# Patient Record
Sex: Female | Born: 1960 | Race: White | Hispanic: No | Marital: Married | Smoking: Current some day smoker
Health system: Southern US, Community
[De-identification: ages and names within clinical notes are randomized; demographics above are authoritative.]

## PROBLEM LIST (undated history)

## (undated) ENCOUNTER — Ambulatory Visit: Admission: EM | Payer: No Typology Code available for payment source

## (undated) DIAGNOSIS — G4730 Sleep apnea, unspecified: Secondary | ICD-10-CM

## (undated) DIAGNOSIS — D649 Anemia, unspecified: Secondary | ICD-10-CM

## (undated) DIAGNOSIS — M1990 Unspecified osteoarthritis, unspecified site: Secondary | ICD-10-CM

## (undated) DIAGNOSIS — I10 Essential (primary) hypertension: Secondary | ICD-10-CM

## (undated) DIAGNOSIS — E785 Hyperlipidemia, unspecified: Secondary | ICD-10-CM

## (undated) DIAGNOSIS — K219 Gastro-esophageal reflux disease without esophagitis: Secondary | ICD-10-CM

## (undated) DIAGNOSIS — E119 Type 2 diabetes mellitus without complications: Secondary | ICD-10-CM

## (undated) HISTORY — DX: Gastro-esophageal reflux disease without esophagitis: K21.9

## (undated) HISTORY — DX: Type 2 diabetes mellitus without complications: E11.9

## (undated) HISTORY — DX: Sleep apnea, unspecified: G47.30

## (undated) HISTORY — PX: ABDOMINAL HYSTERECTOMY: SHX81

## (undated) HISTORY — PX: APPENDECTOMY: SHX54

## (undated) HISTORY — DX: Hyperlipidemia, unspecified: E78.5

## (undated) HISTORY — DX: Unspecified osteoarthritis, unspecified site: M19.90

## (undated) HISTORY — PX: LAPAROSCOPIC CHOLECYSTECTOMY: SUR755

## (undated) HISTORY — PX: GASTRIC BYPASS: SHX52

## (undated) HISTORY — DX: Essential (primary) hypertension: I10

---

## 2005-05-30 ENCOUNTER — Ambulatory Visit: Payer: Self-pay

## 2012-01-17 HISTORY — PX: GASTRIC BYPASS: SHX52

## 2016-08-07 ENCOUNTER — Emergency Department
Admission: EM | Admit: 2016-08-07 | Discharge: 2016-08-07 | Disposition: A | Discharge status: Home / Self Care | Payer: 59 | Attending: Emergency Medicine | Admitting: Emergency Medicine

## 2016-08-07 ENCOUNTER — Encounter: Payer: Self-pay | Admitting: *Deleted

## 2016-08-07 DIAGNOSIS — J029 Acute pharyngitis, unspecified: Secondary | ICD-10-CM | POA: Diagnosis not present

## 2016-08-07 DIAGNOSIS — H9209 Otalgia, unspecified ear: Secondary | ICD-10-CM | POA: Diagnosis not present

## 2016-08-07 DIAGNOSIS — Z5321 Procedure and treatment not carried out due to patient leaving prior to being seen by health care provider: Secondary | ICD-10-CM | POA: Insufficient documentation

## 2016-08-07 NOTE — ED Notes (Signed)
Patient told Natasha Brine RN she was supposed to be seen at [facility] this AM.  Left after triage to go to  [facility].

## 2016-08-07 NOTE — ED Triage Notes (Signed)
States nasal dryness, sore throat and ear ache for several months

## 2017-04-02 ENCOUNTER — Other Ambulatory Visit: Payer: Self-pay | Admitting: Medical Oncology

## 2017-04-02 DIAGNOSIS — Z1231 Encounter for screening mammogram for malignant neoplasm of breast: Secondary | ICD-10-CM

## 2017-04-09 ENCOUNTER — Encounter (INDEPENDENT_AMBULATORY_CARE_PROVIDER_SITE_OTHER): Payer: Self-pay

## 2017-04-09 ENCOUNTER — Ambulatory Visit
Admission: RE | Admit: 2017-04-09 | Discharge: 2017-04-09 | Disposition: A | Discharge status: Home / Self Care | Payer: 59 | Source: Ambulatory Visit | Attending: Medical Oncology | Admitting: Medical Oncology

## 2017-04-09 DIAGNOSIS — N6310 Unspecified lump in the right breast, unspecified quadrant: Secondary | ICD-10-CM | POA: Diagnosis not present

## 2017-04-09 DIAGNOSIS — Z1231 Encounter for screening mammogram for malignant neoplasm of breast: Secondary | ICD-10-CM | POA: Diagnosis present

## 2017-04-09 DIAGNOSIS — R928 Other abnormal and inconclusive findings on diagnostic imaging of breast: Secondary | ICD-10-CM | POA: Insufficient documentation

## 2017-04-12 ENCOUNTER — Inpatient Hospital Stay
Admission: RE | Admit: 2017-04-12 | Discharge: 2017-04-12 | Disposition: A | Discharge status: Home / Self Care | Payer: Self-pay | Source: Ambulatory Visit | Attending: *Deleted | Admitting: *Deleted

## 2017-04-12 ENCOUNTER — Other Ambulatory Visit: Payer: Self-pay | Admitting: *Deleted

## 2017-04-12 DIAGNOSIS — Z9289 Personal history of other medical treatment: Secondary | ICD-10-CM

## 2017-04-16 ENCOUNTER — Other Ambulatory Visit: Payer: Self-pay | Admitting: Medical Oncology

## 2017-04-16 DIAGNOSIS — N6310 Unspecified lump in the right breast, unspecified quadrant: Secondary | ICD-10-CM

## 2017-04-16 DIAGNOSIS — R928 Other abnormal and inconclusive findings on diagnostic imaging of breast: Secondary | ICD-10-CM

## 2017-04-27 ENCOUNTER — Other Ambulatory Visit: Payer: 59

## 2017-04-27 ENCOUNTER — Ambulatory Visit: Payer: 59

## 2017-05-04 ENCOUNTER — Ambulatory Visit
Admission: RE | Admit: 2017-05-04 | Discharge: 2017-05-04 | Disposition: A | Discharge status: Home / Self Care | Payer: 59 | Source: Ambulatory Visit | Attending: Medical Oncology | Admitting: Medical Oncology

## 2017-05-04 DIAGNOSIS — N6310 Unspecified lump in the right breast, unspecified quadrant: Secondary | ICD-10-CM | POA: Diagnosis present

## 2017-05-04 DIAGNOSIS — R928 Other abnormal and inconclusive findings on diagnostic imaging of breast: Secondary | ICD-10-CM

## 2017-05-04 DIAGNOSIS — N6313 Unspecified lump in the right breast, lower outer quadrant: Secondary | ICD-10-CM | POA: Diagnosis not present

## 2017-05-07 ENCOUNTER — Other Ambulatory Visit: Payer: Self-pay | Admitting: Medical Oncology

## 2017-05-07 DIAGNOSIS — N6310 Unspecified lump in the right breast, unspecified quadrant: Secondary | ICD-10-CM

## 2017-05-07 DIAGNOSIS — R928 Other abnormal and inconclusive findings on diagnostic imaging of breast: Secondary | ICD-10-CM

## 2017-05-16 HISTORY — PX: BREAST BIOPSY: SHX20

## 2017-05-17 ENCOUNTER — Ambulatory Visit
Admission: RE | Admit: 2017-05-17 | Discharge: 2017-05-17 | Disposition: A | Discharge status: Home / Self Care | Payer: Managed Care, Other (non HMO) | Source: Ambulatory Visit | Attending: Medical Oncology | Admitting: Medical Oncology

## 2017-05-17 DIAGNOSIS — N6001 Solitary cyst of right breast: Secondary | ICD-10-CM | POA: Insufficient documentation

## 2017-05-17 DIAGNOSIS — N6310 Unspecified lump in the right breast, unspecified quadrant: Secondary | ICD-10-CM

## 2017-05-17 DIAGNOSIS — R928 Other abnormal and inconclusive findings on diagnostic imaging of breast: Secondary | ICD-10-CM

## 2018-09-24 ENCOUNTER — Other Ambulatory Visit: Payer: Self-pay | Admitting: Medical Oncology

## 2018-09-24 DIAGNOSIS — Z1231 Encounter for screening mammogram for malignant neoplasm of breast: Secondary | ICD-10-CM

## 2018-10-09 ENCOUNTER — Other Ambulatory Visit: Payer: Self-pay

## 2018-10-09 ENCOUNTER — Encounter (INDEPENDENT_AMBULATORY_CARE_PROVIDER_SITE_OTHER): Payer: Self-pay

## 2018-10-09 ENCOUNTER — Ambulatory Visit
Admission: RE | Admit: 2018-10-09 | Discharge: 2018-10-09 | Disposition: A | Discharge status: Home / Self Care | Payer: Commercial Managed Care - PPO | Source: Ambulatory Visit | Attending: Medical Oncology | Admitting: Medical Oncology

## 2018-10-09 DIAGNOSIS — Z1231 Encounter for screening mammogram for malignant neoplasm of breast: Secondary | ICD-10-CM | POA: Diagnosis present

## 2019-09-08 ENCOUNTER — Telehealth: Payer: Self-pay | Admitting: Emergency Medicine

## 2019-09-08 ENCOUNTER — Other Ambulatory Visit: Payer: Self-pay

## 2019-09-08 ENCOUNTER — Encounter: Payer: Self-pay | Admitting: Surgery

## 2019-09-08 ENCOUNTER — Ambulatory Visit: Payer: Commercial Managed Care - PPO | Admitting: Surgery

## 2019-09-08 ENCOUNTER — Telehealth: Payer: Self-pay | Admitting: Surgery

## 2019-09-08 VITALS — BP 162/84 | HR 68 | Temp 98.2°F | Resp 12 | Ht 66.0 in | Wt 269.4 lb

## 2019-09-08 DIAGNOSIS — L723 Sebaceous cyst: Secondary | ICD-10-CM

## 2019-09-08 NOTE — Telephone Encounter (Signed)
Medical Clearance sent to [facility], [place].  [phone]

## 2019-09-08 NOTE — Telephone Encounter (Signed)
Pt has been advised of Pre-Admission date/time, COVID Testing date and Surgery date.  Surgery Date: 09/23/19 Preadmission Testing Date: 09/17/19 (phone 8a-1p) Covid Testing Date: 09/19/19 - patient advised to go to the [facility] between 8a-1p   Patient has been made aware to call [phone], between 1-3:00pm the day before surgery, to find out what time to arrive for surgery.

## 2019-09-08 NOTE — H&P (View-Only) (Signed)
09/08/2019  Reason for Visit:  Lower back cyst  Referring Provider:  Nelwyn Salisbury, PA-C  History of Present Illness: Natasha Contreras is a 59 y.o. female presenting for evaluation of a lower back cyst vs pilonidal cyst.  The patient reports that she has had this for many years, at least 40 years.  She reports that some providers in the past have alluded to this being related to her weight (she she was about 150 lbs) or to a herniated disc.  The patient reports that it feels soft, like has fluid inside.  Has never drained spontaneously or via procedure, but it does flare up from time to time with getting larger, more tender, and feeling warm to touch.  Currently she's getting better from a flare up and reports still having some discomfort.  When the episode is bad, she has a hard time sitting due to the pain, and also feels more discomfort when straining for a bowel movement.  Past Medical History: Past Medical History:  Diagnosis Date  . Arthritis   . Diabetes mellitus, type 2 ([place])   . GERD (gastroesophageal reflux disease)   . Hypertension   . Sleep apnea      Past Surgical History: Past Surgical History:  Procedure Laterality Date  . ABDOMINAL HYSTERECTOMY    . APPENDECTOMY    . BREAST BIOPSY Right 05/2017   neg  . GASTRIC BYPASS    . LAPAROSCOPIC CHOLECYSTECTOMY      Home Medications: Prior to Admission medications   Medication Sig Start Date End Date Taking? Authorizing Provider  tylenol       omeprazole 40 mg daily      rosuvastatin (CRESTOR) 10 MG tablet Take 10 mg by mouth daily.   Yes [provider]    Allergies: Allergies  Allergen Reactions  . Clindamycin Swelling    Tongue swelling  . Penicillins     Social History:  reports that she has been smoking cigarettes. She has never used smokeless tobacco. She reports that she does not drink alcohol and does not use drugs.   Family History: Family History  Problem Relation Age of Onset  . Breast  cancer Maternal Grandmother 1    Review of Systems: Review of Systems  Constitutional: Negative for chills and fever.  HENT: Negative for hearing loss.   Respiratory: Negative for shortness of breath.   Cardiovascular: Negative for chest pain.  Gastrointestinal: Negative for abdominal pain, nausea and vomiting.  Genitourinary: Negative for dysuria.  Musculoskeletal: Positive for joint pain.  Skin:       Pain in lower back cyst area  Neurological: Negative for dizziness.  Psychiatric/Behavioral: Negative for depression.    Physical Exam BP (!) 162/84   Pulse 68   Temp 98.2 F (36.8 C)   Resp 12   Ht 5\' 6"  (1.676 m)   Wt 269 lb 6.4 oz (122.2 kg)   SpO2 94%   BMI 43.48 kg/m  CONSTITUTIONAL: No acute distress HEENT:  Normocephalic, atraumatic, extraocular motion intact. NECK: Trachea is midline, and there is no jugular venous distension.  RESPIRATORY:  Lungs are clear, and breath sounds are equal bilaterally. Normal respiratory effort without pathologic use of accessory muscles. CARDIOVASCULAR: Heart is regular without murmurs, gallops, or rubs. GI: The abdomen is soft, obese, non-distended, non-tender.  MUSCULOSKELETAL:  Normal muscle strength and tone in all four extremities.  No peripheral edema or cyanosis. SKIN: The lower back has a large area measuring 15 cm x 12 cm in  size, soft, fluid like sensation, most likely a large sebaceous cyst.  The gluteal cleft itself does not show any sinuses or areas of cysts.  NEUROLOGIC:  Motor and sensation is grossly normal.  Cranial nerves are grossly intact. PSYCH:  Alert and oriented to person, place and time. Affect is normal.  Laboratory Analysis: No results found for this or any previous visit (from the past 24 hour(s)).  Imaging: No results found.  Assessment and Plan: This is a 59 y.o. female with a large lower back cyst, likely sebaceous cyst.  --Discussed with the patient that this could be a sebaceous cyst of the lower  back.  It is higher up in location to be a pilonidal cyst, particularly with the gluteal cleft area looking intact.  Discussed with her that this can be excised, but due to the size of it, would be better to do in the OR.  We would plan for lower back cyst excision in the OR, with drain placement to help collapse the large cavity that would be left behind.  Discussed the possibility of sutures vs staples to close the skin.  Discussed risks of bleeding, infection, injury to surrounding structures.  Discussed the need for a drain to help empty any fluid in the area.  She's willing to proceed. --Will schedule her for 9/7 in the OR.  She understands that she would need COVID test prior to surgery.  Face-to-face time spent with the patient and care providers was 60 minutes, with more than 50% of the time spent counseling, educating, and coordinating care of the patient.     Melvyn Neth, [facility]

## 2019-09-08 NOTE — Progress Notes (Signed)
09/08/2019  Reason for Visit:  Lower back cyst  Referring Provider:  Nelwyn Salisbury, PA-C  History of Present Illness: Natasha Contreras is a 59 y.o. female presenting for evaluation of a lower back cyst vs pilonidal cyst.  The patient reports that she has had this for many years, at least 23 years.  She reports that some providers in the past have alluded to this being related to her weight (she she was about 150 lbs) or to a herniated disc.  The patient reports that it feels soft, like has fluid inside.  Has never drained spontaneously or via procedure, but it does flare up from time to time with getting larger, more tender, and feeling warm to touch.  Currently she's getting better from a flare up and reports still having some discomfort.  When the episode is bad, she has a hard time sitting due to the pain, and also feels more discomfort when straining for a bowel movement.  Past Medical History: Past Medical History:  Diagnosis Date  . Arthritis   . Diabetes mellitus, type 2 ([place])   . GERD (gastroesophageal reflux disease)   . Hypertension   . Sleep apnea      Past Surgical History: Past Surgical History:  Procedure Laterality Date  . ABDOMINAL HYSTERECTOMY    . APPENDECTOMY    . BREAST BIOPSY Right 05/2017   neg  . GASTRIC BYPASS    . LAPAROSCOPIC CHOLECYSTECTOMY      Home Medications: Prior to Admission medications   Medication Sig Start Date End Date Taking? Authorizing Provider  tylenol       omeprazole 40 mg daily      rosuvastatin (CRESTOR) 10 MG tablet Take 10 mg by mouth daily.   Yes [provider]    Allergies: Allergies  Allergen Reactions  . Clindamycin Swelling    Tongue swelling  . Penicillins     Social History:  reports that she has been smoking cigarettes. She has never used smokeless tobacco. She reports that she does not drink alcohol and does not use drugs.   Family History: Family History  Problem Relation Age of Onset  . Breast  cancer Maternal Grandmother 65    Review of Systems: Review of Systems  Constitutional: Negative for chills and fever.  HENT: Negative for hearing loss.   Respiratory: Negative for shortness of breath.   Cardiovascular: Negative for chest pain.  Gastrointestinal: Negative for abdominal pain, nausea and vomiting.  Genitourinary: Negative for dysuria.  Musculoskeletal: Positive for joint pain.  Skin:       Pain in lower back cyst area  Neurological: Negative for dizziness.  Psychiatric/Behavioral: Negative for depression.    Physical Exam BP (!) 162/84   Pulse 68   Temp 98.2 F (36.8 C)   Resp 12   Ht 5\' 6"  (1.676 m)   Wt 269 lb 6.4 oz (122.2 kg)   SpO2 94%   BMI 43.48 kg/m  CONSTITUTIONAL: No acute distress HEENT:  Normocephalic, atraumatic, extraocular motion intact. NECK: Trachea is midline, and there is no jugular venous distension.  RESPIRATORY:  Lungs are clear, and breath sounds are equal bilaterally. Normal respiratory effort without pathologic use of accessory muscles. CARDIOVASCULAR: Heart is regular without murmurs, gallops, or rubs. GI: The abdomen is soft, obese, non-distended, non-tender.  MUSCULOSKELETAL:  Normal muscle strength and tone in all four extremities.  No peripheral edema or cyanosis. SKIN: The lower back has a large area measuring 15 cm x 12 cm in  size, soft, fluid like sensation, most likely a large sebaceous cyst.  The gluteal cleft itself does not show any sinuses or areas of cysts.  NEUROLOGIC:  Motor and sensation is grossly normal.  Cranial nerves are grossly intact. PSYCH:  Alert and oriented to person, place and time. Affect is normal.  Laboratory Analysis: No results found for this or any previous visit (from the past 24 hour(s)).  Imaging: No results found.  Assessment and Plan: This is a 59 y.o. female with a large lower back cyst, likely sebaceous cyst.  --Discussed with the patient that this could be a sebaceous cyst of the lower  back.  It is higher up in location to be a pilonidal cyst, particularly with the gluteal cleft area looking intact.  Discussed with her that this can be excised, but due to the size of it, would be better to do in the OR.  We would plan for lower back cyst excision in the OR, with drain placement to help collapse the large cavity that would be left behind.  Discussed the possibility of sutures vs staples to close the skin.  Discussed risks of bleeding, infection, injury to surrounding structures.  Discussed the need for a drain to help empty any fluid in the area.  She's willing to proceed. --Will schedule her for 9/7 in the OR.  She understands that she would need COVID test prior to surgery.  Face-to-face time spent with the patient and care providers was 60 minutes, with more than 50% of the time spent counseling, educating, and coordinating care of the patient.     Melvyn Neth, [facility]

## 2019-09-08 NOTE — Patient Instructions (Addendum)
Our surgery scheduler will contact you to schedule your surgery. Please have the [facility] Sheet available when she calls you.   We will also send a Medical Clearance to your doctor. Their office will contact you if they need to see you to get this clearance done.   Please call the office if you have any questions or concerns.

## 2019-09-16 ENCOUNTER — Telehealth: Payer: Self-pay | Admitting: Emergency Medicine

## 2019-09-16 NOTE — Telephone Encounter (Signed)
Called office in regards to clearance form. Per Almyra Free, she states they received form and it is placed on doctors desk.

## 2019-09-17 ENCOUNTER — Other Ambulatory Visit: Payer: Self-pay

## 2019-09-17 ENCOUNTER — Other Ambulatory Visit
Admission: RE | Admit: 2019-09-17 | Discharge: 2019-09-17 | Disposition: A | Discharge status: Home / Self Care | Payer: Commercial Managed Care - PPO | Source: Ambulatory Visit | Attending: Surgery | Admitting: Surgery

## 2019-09-17 HISTORY — DX: Anemia, unspecified: D64.9

## 2019-09-17 NOTE — Patient Instructions (Signed)
Your procedure is scheduled on: September 23, 2019 Tuesday  Report to Day Surgery on the 2nd floor of the [facility]. To find out your arrival time, please call [phone] between 1PM - 3PM on: Friday September 19, 2019  REMEMBER: Instructions that are not followed completely may result in serious medical risk, up to and including death; or upon the discretion of your surgeon and anesthesiologist your surgery may need to be rescheduled.  Do not eat food after midnight the night before surgery.  No gum chewing, lozengers or hard candies.  You may however, drink CLEAR liquids up to 2 hours before you are scheduled to arrive for your surgery. Do not drink anything within 2 hours of your scheduled arrival time.  Clear liquids include: - water   Do NOT drink anything that is not on this list.  Type 1 and Type 2 diabetics should only drink water.   TAKE THESE MEDICATIONS THE MORNING OF SURGERY WITH A SIP OF WATER: ROSUVASTATIN  One week prior to surgery: Stop Anti-inflammatories (NSAIDS) such as Advil, Aleve, Ibuprofen, Motrin, Naproxen, Naprosyn and ASPIRIN  Aspirin based products such as Excedrin, Goodys Powder, BC Powder. Stop ANY OVER THE COUNTER supplements until after surgery. (You may continue taking  Tylenol, FERROUS Vitamin D, Vitamin B, and multivitamin.)  No Alcohol for 24 hours before or after surgery.  No Smoking including e-cigarettes for 24 hours prior to surgery.  No chewable tobacco products for at least 6 hours prior to surgery.  No nicotine patches on the day of surgery.  Do not use any "recreational" drugs for at least a week prior to your surgery.  Please be advised that the combination of cocaine and anesthesia may have negative outcomes, up to and including death. If you test positive for cocaine, your surgery will be cancelled.  On the morning of surgery brush your teeth with toothpaste and water, you may rinse your mouth with mouthwash if you wish. Do not  swallow any toothpaste or mouthwash.  Do not wear jewelry, make-up, hairpins, clips or nail polish.  Do not wear lotions, powders, or perfumes.   Do not shave 48 hours prior to surgery.   Contact lenses, hearing aids and dentures may not be worn into surgery.  Do not bring valuables to the hospital. [facility] is not responsible for any missing/lost belongings or valuables.   Use CHG Soap as directed on instruction sheet.  Bring your C-PAP to the hospital with you in case you may have to spend the night.   Notify your doctor if there is any change in your medical condition (cold, fever, infection).  Wear comfortable clothing (specific to your surgery type) to the hospital.  Plan for stool softeners for home use; pain medications have a tendency to cause constipation. You can also help prevent constipation by eating foods high in fiber such as fruits and vegetables and drinking plenty of fluids as your diet allows.  After surgery, you can help prevent lung complications by doing breathing exercises.  Take deep breaths and cough every 1-2 hours. Your doctor may order a device called an Incentive Spirometer to help you take deep breaths. When coughing or sneezing, hold a pillow firmly against your incision with both hands. This is called "splinting." Doing this helps protect your incision. It also decreases belly discomfort.  If you are being admitted to the hospital overnight, leave your suitcase in the car. After surgery it may be brought to your room.  If you  are being discharged the day of surgery, you will not be allowed to drive home. You will need a responsible adult (18 years or older) to drive you home and stay with you that night.   Please call the [facility]. at [phone] if you have any questions about these instructions.  Visitation Policy:  Patients undergoing a surgery or procedure may have one family member or support person with them as long  as that person is not COVID-19 positive or experiencing its symptoms.  That person may remain in the waiting area during the procedure.  Inpatient Visitation Update:   In an effort to ensure the safety of our team members and our patients, we are implementing a change to our visitation policy:  Effective Monday, Aug. 9, at 7 a.m., inpatients will be allowed one support person.  o The support person may change daily.  o The support person must pass our screening, gel in and out, and wear a mask at all times, including in the patient's room.  o Patients must also wear a mask when staff or their support person are in the room.  o Masking is required regardless of vaccination status.  Systemwide, no visitors 17 or younger.

## 2019-09-19 ENCOUNTER — Other Ambulatory Visit: Payer: Self-pay

## 2019-09-19 ENCOUNTER — Other Ambulatory Visit
Admission: RE | Admit: 2019-09-19 | Discharge: 2019-09-19 | Disposition: A | Discharge status: Home / Self Care | Payer: Commercial Managed Care - PPO | Source: Ambulatory Visit | Attending: Surgery | Admitting: Surgery

## 2019-09-19 DIAGNOSIS — Z20822 Contact with and (suspected) exposure to covid-19: Secondary | ICD-10-CM | POA: Diagnosis not present

## 2019-09-19 DIAGNOSIS — Z01812 Encounter for preprocedural laboratory examination: Secondary | ICD-10-CM | POA: Insufficient documentation

## 2019-09-19 LAB — SARS CORONAVIRUS 2 (TAT 6-24 HRS): SARS Coronavirus 2: NEGATIVE

## 2019-09-19 NOTE — Progress Notes (Signed)
Medical Clearance has been received from Nelwyn Salisbury PA-C's office. The patient has been cleared for surgery at Low risk.

## 2019-09-22 MED ORDER — DEXTROSE 5 % IV SOLN
3.0000 g | INTRAVENOUS | Status: AC
  Administered 2019-09-23: 3 g via INTRAVENOUS
  Filled 2019-09-22: qty 3000
  Filled 2019-09-22: qty 3

## 2019-09-23 ENCOUNTER — Encounter: Payer: Self-pay | Admitting: Surgery

## 2019-09-23 ENCOUNTER — Ambulatory Visit: Payer: Commercial Managed Care - PPO | Admitting: Urgent Care

## 2019-09-23 ENCOUNTER — Ambulatory Visit
Admission: RE | Admit: 2019-09-23 | Discharge: 2019-09-23 | Disposition: A | Discharge status: Home / Self Care | Payer: Commercial Managed Care - PPO | Source: Ambulatory Visit | Attending: Surgery | Admitting: Surgery

## 2019-09-23 ENCOUNTER — Encounter
Admission: RE | Disposition: A | Discharge status: Home / Self Care | Payer: Self-pay | Source: Ambulatory Visit | Attending: Surgery

## 2019-09-23 ENCOUNTER — Ambulatory Visit: Payer: Commercial Managed Care - PPO | Admitting: Certified Registered Nurse Anesthetist

## 2019-09-23 ENCOUNTER — Other Ambulatory Visit: Payer: Self-pay

## 2019-09-23 DIAGNOSIS — Z9049 Acquired absence of other specified parts of digestive tract: Secondary | ICD-10-CM | POA: Insufficient documentation

## 2019-09-23 DIAGNOSIS — Z79899 Other long term (current) drug therapy: Secondary | ICD-10-CM | POA: Insufficient documentation

## 2019-09-23 DIAGNOSIS — I10 Essential (primary) hypertension: Secondary | ICD-10-CM | POA: Insufficient documentation

## 2019-09-23 DIAGNOSIS — E119 Type 2 diabetes mellitus without complications: Secondary | ICD-10-CM | POA: Insufficient documentation

## 2019-09-23 DIAGNOSIS — D171 Benign lipomatous neoplasm of skin and subcutaneous tissue of trunk: Secondary | ICD-10-CM | POA: Diagnosis not present

## 2019-09-23 DIAGNOSIS — Z881 Allergy status to other antibiotic agents status: Secondary | ICD-10-CM | POA: Insufficient documentation

## 2019-09-23 DIAGNOSIS — M1990 Unspecified osteoarthritis, unspecified site: Secondary | ICD-10-CM | POA: Insufficient documentation

## 2019-09-23 DIAGNOSIS — K219 Gastro-esophageal reflux disease without esophagitis: Secondary | ICD-10-CM | POA: Diagnosis not present

## 2019-09-23 DIAGNOSIS — Z9884 Bariatric surgery status: Secondary | ICD-10-CM | POA: Diagnosis not present

## 2019-09-23 DIAGNOSIS — Z90710 Acquired absence of both cervix and uterus: Secondary | ICD-10-CM | POA: Insufficient documentation

## 2019-09-23 DIAGNOSIS — Z803 Family history of malignant neoplasm of breast: Secondary | ICD-10-CM | POA: Diagnosis not present

## 2019-09-23 DIAGNOSIS — G4730 Sleep apnea, unspecified: Secondary | ICD-10-CM | POA: Diagnosis not present

## 2019-09-23 DIAGNOSIS — D649 Anemia, unspecified: Secondary | ICD-10-CM | POA: Diagnosis not present

## 2019-09-23 DIAGNOSIS — Z880 Allergy status to penicillin: Secondary | ICD-10-CM | POA: Insufficient documentation

## 2019-09-23 DIAGNOSIS — L723 Sebaceous cyst: Secondary | ICD-10-CM

## 2019-09-23 DIAGNOSIS — F17210 Nicotine dependence, cigarettes, uncomplicated: Secondary | ICD-10-CM | POA: Insufficient documentation

## 2019-09-23 DIAGNOSIS — D179 Benign lipomatous neoplasm, unspecified: Secondary | ICD-10-CM | POA: Diagnosis present

## 2019-09-23 DIAGNOSIS — Z6841 Body Mass Index (BMI) 40.0 and over, adult: Secondary | ICD-10-CM | POA: Diagnosis not present

## 2019-09-23 HISTORY — PX: EXCISION OF BACK LESION: SHX6597

## 2019-09-23 LAB — GLUCOSE, CAPILLARY: Glucose-Capillary: 115 mg/dL — ABNORMAL HIGH (ref 70–99)

## 2019-09-23 SURGERY — EXCISION, LESION, BACK
Anesthesia: General

## 2019-09-23 MED ORDER — SUGAMMADEX SODIUM 500 MG/5ML IV SOLN
INTRAVENOUS | Status: AC
  Filled 2019-09-23: qty 5

## 2019-09-23 MED ORDER — GABAPENTIN 300 MG PO CAPS: 300.0000 mg | ORAL_CAPSULE | ORAL | Status: AC

## 2019-09-23 MED ORDER — BUPIVACAINE LIPOSOME 1.3 % IJ SUSP
INTRAMUSCULAR | Status: AC
  Filled 2019-09-23: qty 20

## 2019-09-23 MED ORDER — OXYCODONE HCL 5 MG PO TABS: 5.0000 mg | ORAL_TABLET | ORAL | 0 refills | Status: DC | PRN

## 2019-09-23 MED ORDER — ONDANSETRON HCL 4 MG/2ML IJ SOLN
INTRAMUSCULAR | Status: DC | PRN
  Administered 2019-09-23: 4 mg via INTRAVENOUS

## 2019-09-23 MED ORDER — CHLORHEXIDINE GLUCONATE CLOTH 2 % EX PADS: 6.0000 | MEDICATED_PAD | Freq: Once | CUTANEOUS | Status: DC

## 2019-09-23 MED ORDER — CHLORHEXIDINE GLUCONATE 0.12 % MT SOLN
OROMUCOSAL | Status: AC
  Administered 2019-09-23: 15 mL via OROMUCOSAL
  Filled 2019-09-23: qty 15

## 2019-09-23 MED ORDER — LIDOCAINE HCL (CARDIAC) PF 100 MG/5ML IV SOSY
PREFILLED_SYRINGE | INTRAVENOUS | Status: DC | PRN
  Administered 2019-09-23: 80 mg via INTRAVENOUS

## 2019-09-23 MED ORDER — OXYCODONE HCL 5 MG PO TABS
ORAL_TABLET | ORAL | Status: AC
  Filled 2019-09-23: qty 1

## 2019-09-23 MED ORDER — FENTANYL CITRATE (PF) 100 MCG/2ML IJ SOLN
INTRAMUSCULAR | Status: DC | PRN
Start: 2019-09-23 — End: 2019-09-23
  Administered 2019-09-23: 25 ug via INTRAVENOUS
  Administered 2019-09-23: 50 ug via INTRAVENOUS
  Administered 2019-09-23: 25 ug via INTRAVENOUS

## 2019-09-23 MED ORDER — LACTATED RINGERS IV SOLN: INTRAVENOUS | Status: DC

## 2019-09-23 MED ORDER — ACETAMINOPHEN 500 MG PO TABS: 1000.0000 mg | ORAL_TABLET | ORAL | Status: AC

## 2019-09-23 MED ORDER — BUPIVACAINE LIPOSOME 1.3 % IJ SUSP: 20.0000 mL | Freq: Once | INTRAMUSCULAR | Status: DC

## 2019-09-23 MED ORDER — FAMOTIDINE 20 MG PO TABS: 20.0000 mg | ORAL_TABLET | Freq: Once | ORAL | Status: AC

## 2019-09-23 MED ORDER — ROCURONIUM BROMIDE 100 MG/10ML IV SOLN
INTRAVENOUS | Status: DC | PRN
  Administered 2019-09-23 (×2): 10 mg via INTRAVENOUS
  Administered 2019-09-23: 50 mg via INTRAVENOUS

## 2019-09-23 MED ORDER — ORAL CARE MOUTH RINSE: 15.0000 mL | Freq: Once | OROMUCOSAL | Status: AC

## 2019-09-23 MED ORDER — FENTANYL CITRATE (PF) 100 MCG/2ML IJ SOLN: 25.0000 ug | INTRAMUSCULAR | Status: DC | PRN

## 2019-09-23 MED ORDER — BUPIVACAINE HCL (PF) 0.5 % IJ SOLN
INTRAMUSCULAR | Status: AC
  Filled 2019-09-23: qty 30

## 2019-09-23 MED ORDER — IBUPROFEN 600 MG PO TABS: 600.0000 mg | ORAL_TABLET | Freq: Three times a day (TID) | ORAL | 1 refills | Status: AC | PRN

## 2019-09-23 MED ORDER — SUGAMMADEX SODIUM 500 MG/5ML IV SOLN
INTRAVENOUS | Status: DC | PRN
  Administered 2019-09-23: 250 mg via INTRAVENOUS

## 2019-09-23 MED ORDER — ONDANSETRON HCL 4 MG/2ML IJ SOLN: 4.0000 mg | Freq: Once | INTRAMUSCULAR | Status: DC | PRN

## 2019-09-23 MED ORDER — DEXAMETHASONE SODIUM PHOSPHATE 10 MG/ML IJ SOLN
INTRAMUSCULAR | Status: DC | PRN
  Administered 2019-09-23: 5 mg via INTRAVENOUS

## 2019-09-23 MED ORDER — PROPOFOL 10 MG/ML IV BOLUS
INTRAVENOUS | Status: DC | PRN
  Administered 2019-09-23: 150 mg via INTRAVENOUS
  Administered 2019-09-23: 50 mg via INTRAVENOUS

## 2019-09-23 MED ORDER — FENTANYL CITRATE (PF) 100 MCG/2ML IJ SOLN
INTRAMUSCULAR | Status: AC
  Filled 2019-09-23: qty 2

## 2019-09-23 MED ORDER — MIDAZOLAM HCL 2 MG/2ML IJ SOLN
INTRAMUSCULAR | Status: DC | PRN
  Administered 2019-09-23: 2 mg via INTRAVENOUS

## 2019-09-23 MED ORDER — KETOROLAC TROMETHAMINE 30 MG/ML IJ SOLN
INTRAMUSCULAR | Status: DC | PRN
  Administered 2019-09-23: 30 mg via INTRAVENOUS

## 2019-09-23 MED ORDER — PROPOFOL 10 MG/ML IV BOLUS
INTRAVENOUS | Status: AC
  Filled 2019-09-23: qty 20

## 2019-09-23 MED ORDER — MIDAZOLAM HCL 2 MG/2ML IJ SOLN
INTRAMUSCULAR | Status: AC
  Filled 2019-09-23: qty 2

## 2019-09-23 MED ORDER — OXYCODONE HCL 5 MG PO TABS: 5.0000 mg | ORAL_TABLET | Freq: Once | ORAL | Status: DC

## 2019-09-23 MED ORDER — FAMOTIDINE 20 MG PO TABS
ORAL_TABLET | ORAL | Status: AC
  Administered 2019-09-23: 20 mg via ORAL
  Filled 2019-09-23: qty 1

## 2019-09-23 MED ORDER — GABAPENTIN 300 MG PO CAPS
ORAL_CAPSULE | ORAL | Status: AC
  Administered 2019-09-23: 300 mg via ORAL
  Filled 2019-09-23: qty 1

## 2019-09-23 MED ORDER — ACETAMINOPHEN 500 MG PO TABS
ORAL_TABLET | ORAL | Status: AC
  Administered 2019-09-23: 1000 mg via ORAL
  Filled 2019-09-23: qty 2

## 2019-09-23 MED ORDER — CHLORHEXIDINE GLUCONATE 0.12 % MT SOLN: 15.0000 mL | Freq: Once | OROMUCOSAL | Status: AC

## 2019-09-23 MED ORDER — BUPIVACAINE LIPOSOME 1.3 % IJ SUSP
INTRAMUSCULAR | Status: DC | PRN
  Administered 2019-09-23: 50 mL

## 2019-09-23 MED ORDER — EPHEDRINE SULFATE 50 MG/ML IJ SOLN
INTRAMUSCULAR | Status: DC | PRN
  Administered 2019-09-23: 5 mg via INTRAVENOUS
  Administered 2019-09-23: 10 mg via INTRAVENOUS
  Administered 2019-09-23: 5 mg via INTRAVENOUS

## 2019-09-23 MED ORDER — HYDROMORPHONE HCL 1 MG/ML IJ SOLN
INTRAMUSCULAR | Status: AC
Start: 2019-09-23 — End: ?
  Filled 2019-09-23: qty 1

## 2019-09-23 SURGICAL SUPPLY — 33 items
BULB RESERV EVAC DRAIN JP 100C (MISCELLANEOUS) ×2 IMPLANT
CHLORAPREP W/TINT 26 (MISCELLANEOUS) ×2 IMPLANT
COVER PROBE FLX POLY STRL (MISCELLANEOUS) ×2 IMPLANT
COVER WAND RF STERILE (DRAPES) ×2 IMPLANT
DERMABOND ADVANCED (GAUZE/BANDAGES/DRESSINGS) ×1
DERMABOND ADVANCED .7 DNX12 (GAUZE/BANDAGES/DRESSINGS) ×1 IMPLANT
DRAIN CHANNEL JP 15F RND 16 (MISCELLANEOUS) ×2 IMPLANT
DRAPE 3/4 80X56 (DRAPES) ×4 IMPLANT
DRAPE LAPAROTOMY 100X77 ABD (DRAPES) ×2 IMPLANT
DRSG TEGADERM 4X4.75 (GAUZE/BANDAGES/DRESSINGS) ×2 IMPLANT
ELECT CAUTERY BLADE 6.4 (BLADE) ×2 IMPLANT
ELECT REM PT RETURN 9FT ADLT (ELECTROSURGICAL) ×2
ELECTRODE REM PT RTRN 9FT ADLT (ELECTROSURGICAL) ×1 IMPLANT
GAUZE SPONGE 4X4 12PLY STRL (GAUZE/BANDAGES/DRESSINGS) ×2 IMPLANT
GLOVE SURG SYN 7.0 (GLOVE) ×6 IMPLANT
GLOVE SURG SYN 7.5  E (GLOVE) ×3
GLOVE SURG SYN 7.5 E (GLOVE) ×3 IMPLANT
GOWN STRL REUS W/ TWL LRG LVL3 (GOWN DISPOSABLE) ×2 IMPLANT
GOWN STRL REUS W/TWL LRG LVL3 (GOWN DISPOSABLE) ×2
KIT TURNOVER KIT A (KITS) ×2 IMPLANT
LABEL OR SOLS (LABEL) ×2 IMPLANT
NEEDLE HYPO 22GX1.5 SAFETY (NEEDLE) ×2 IMPLANT
NS IRRIG 1000ML POUR BTL (IV SOLUTION) ×2 IMPLANT
PACK BASIN MINOR (MISCELLANEOUS) ×2 IMPLANT
SPONGE LAP 18X18 RF (DISPOSABLE) ×4 IMPLANT
SUT MNCRL 4-0 (SUTURE) ×1
SUT MNCRL 4-0 27XMFL (SUTURE) ×1
SUT VIC AB 0 SH 27 (SUTURE) ×4 IMPLANT
SUT VIC AB 3-0 SH 27 (SUTURE) ×2
SUT VIC AB 3-0 SH 27X BRD (SUTURE) ×2 IMPLANT
SUTURE MNCRL 4-0 27XMF (SUTURE) ×1 IMPLANT
SYR 30ML LL (SYRINGE) ×2 IMPLANT
SYR BULB IRRIG 60ML STRL (SYRINGE) ×2 IMPLANT

## 2019-09-23 NOTE — Progress Notes (Signed)
Patients lip bleeding, DR Piscatella made aware. NO orders noted

## 2019-09-23 NOTE — Discharge Instructions (Signed)

## 2019-09-23 NOTE — Anesthesia Preprocedure Evaluation (Signed)
Anesthesia Evaluation  Patient identified by MRN, date of birth, ID band Patient awake    Reviewed: Allergy & Precautions, NPO status , Patient's Chart, lab work & pertinent test results  History of Anesthesia Complications Negative for: history of anesthetic complications  Airway Mallampati: III       Dental   Pulmonary sleep apnea and Continuous Positive Airway Pressure Ventilation , neg COPD, Current Smoker and Patient abstained from smoking.,           Cardiovascular hypertension, Pt. on medications (-) Past MI and (-) CHF (-) dysrhythmias (-) Valvular Problems/Murmurs     Neuro/Psych neg Seizures    GI/Hepatic Neg liver ROS, GERD  Medicated and Controlled,  Endo/Other  neg diabetesMorbid obesity  Renal/GU negative Renal ROS     Musculoskeletal   Abdominal   Peds  Hematology  (+) anemia ,   Anesthesia Other Findings   Reproductive/Obstetrics                             Anesthesia Physical Anesthesia Plan  ASA: III  Anesthesia Plan: General   Post-op Pain Management:    Induction: Intravenous  PONV Risk Score and Plan: 2 and Dexamethasone and Ondansetron  Airway Management Planned: Oral ETT  Additional Equipment:   Intra-op Plan:   Post-operative Plan:   Informed Consent: I have reviewed the patients History and Physical, chart, labs and discussed the procedure including the risks, benefits and alternatives for the proposed anesthesia with the patient or authorized representative who has indicated his/her understanding and acceptance.       Plan Discussed with:   Anesthesia Plan Comments:         Anesthesia Quick Evaluation

## 2019-09-23 NOTE — Anesthesia Procedure Notes (Signed)
Procedure Name: Intubation Date/Time: 09/23/2019 3:01 PM Performed by: Jerrye Noble, CRNA Pre-anesthesia Checklist: Patient identified, Emergency Drugs available, Suction available and Patient being monitored Patient Re-evaluated:Patient Re-evaluated prior to induction Oxygen Delivery Method: Circle system utilized Preoxygenation: Pre-oxygenation with 100% oxygen Induction Type: IV induction Ventilation: Mask ventilation without difficulty Laryngoscope Size: McGraph and 4 Grade View: Grade I Tube type: Oral Tube size: 7.0 mm Number of attempts: 1 Airway Equipment and Method: Stylet Placement Confirmation: ETT inserted through vocal cords under direct vision,  positive ETCO2 and breath sounds checked- equal and bilateral Secured at: 21 cm Tube secured with: Tape Dental Injury: Teeth and Oropharynx as per pre-operative assessment

## 2019-09-23 NOTE — Op Note (Signed)
  Procedure Date:  09/23/2019  Pre-operative Diagnosis:  Lower back mass  Post-operative Diagnosis:  Lower back lipomas  Procedure:  Excision of lower back lipomas  Surgeon:  Melvyn Neth, MD  Anesthesia:  General endotracheal  Estimated Blood Loss:  20 ml  Specimens:  Large lower back lipomas  Complications:  None  Indications for Procedure:  This is a 59 y.o. female with diagnosis of a symptomatic lower back mass/cyst.  The patient wishes to have this excised. The risks of bleeding, abscess or infection, injury to surrounding structures, and need for further procedures were all discussed with the patient and she was willing to proceed.  Description of Procedure: The patient was correctly identified in the preoperative area and brought into the operating room.  The patient was placed supine with VTE prophylaxis in place.  Appropriate time-outs were performed.  Anesthesia was induced and the patient was intubated.  Appropriate antibiotics were infused.  She was then turned to prone position.  The patient's back was prepped and draped in usual sterile fashion.  A 10 cm incision was made over the mass, and cautery was used to dissect down the subcutaneous tissue.  Skin flaps were created using cautery as well, and this revealed a large cluster of lipomas.  There was no single large mass but multiple smaller ones all together with their capsules adhering to each other.  The lipomas were removed in multiple groups over an area of about 15 x 15 cm.  I dissected as deep as possible without violating any muscular fascia, but some smaller clusters remained due to concerns for safety.  All these were sent off to pathology together.  The cavity was then irrigated and hemostasis was assured with cautery.  Local anesthetic was infused intradermally.  A 15 Fr. Blake drain was inserted into the wound from the right lateral aspect.  The skin edges were then approximated together and to the midline portion  of the wound bed using multiple 2-0 Vicryl sutures in order to decrease the amount of dead space in the wound.  The wound was then further closed in two layers using 3-0 Vicryl and 4-0 Monocryl.  The incision was cleaned and sealed with DermaBond.  The drain was secured using 3-0 Nylon and dressed with 4x4 gauze and tegaderm.  The patient was then placed in supine position, emerged from anesthesia, extubated, and brought to the recovery room for further management.    The patient tolerated the procedure well and all counts were correct at the end of the case.   Melvyn Neth, MD

## 2019-09-23 NOTE — OR Nursing (Signed)
Discussed patient's allergies to PCN and Clindamycin with Dr. Hampton Abbot. He reports it is okay to proceed with ancef.

## 2019-09-23 NOTE — Interval H&P Note (Signed)
History and Physical Interval Note:  09/23/2019 2:25 PM  Natasha Contreras  has presented today for surgery, with the diagnosis of Lower back cyst.  The various methods of treatment have been discussed with the patient and family. After consideration of risks, benefits and other options for treatment, the patient has consented to  Procedure(s): EXCISION OF BACK LESION (N/A) as a surgical intervention.  The patient's history has been reviewed, patient examined, no change in status, stable for surgery.  I have reviewed the patient's chart and labs.  Questions were answered to the patient's satisfaction.     Akaysha Cobern

## 2019-09-23 NOTE — Transfer of Care (Signed)
Immediate Anesthesia Transfer of Care Note  Patient: Natasha Contreras  Procedure(s) Performed: EXCISION OF BACK LESION (N/A )  Patient Location: PACU  Anesthesia Type:General  Level of Consciousness: awake and drowsy  Airway & Oxygen Therapy: Patient Spontanous Breathing and Patient connected to face mask oxygen  Post-op Assessment: Report given to RN and Post -op Vital signs reviewed and stable  Post vital signs: Reviewed and stable  Last Vitals:  Vitals Value Taken Time  BP 159/81 09/23/19 1756  Temp    Pulse 94 09/23/19 1759  Resp 20 09/23/19 1756  SpO2 98 % 09/23/19 1759  Vitals shown include unvalidated device data.  Last Pain:  Vitals:   09/23/19 1226  TempSrc: Temporal  PainSc: 4       Patients Stated Pain Goal: 2 (18/59/09 3112)  Complications: No complications documented.

## 2019-09-24 ENCOUNTER — Encounter: Payer: Self-pay | Admitting: Surgery

## 2019-09-25 LAB — SURGICAL PATHOLOGY

## 2019-09-25 NOTE — Anesthesia Postprocedure Evaluation (Signed)
Anesthesia Post Note  Patient: Natasha Contreras  Procedure(s) Performed: EXCISION OF BACK LESION (N/A )  Patient location during evaluation: PACU Anesthesia Type: General Level of consciousness: awake and alert Pain management: pain level controlled Vital Signs Assessment: post-procedure vital signs reviewed and stable Respiratory status: spontaneous breathing, nonlabored ventilation, respiratory function stable and patient connected to nasal cannula oxygen Cardiovascular status: blood pressure returned to baseline and stable Postop Assessment: no apparent nausea or vomiting Anesthetic complications: no   No complications documented.   Last Vitals:  Vitals:   09/23/19 1850 09/23/19 1854  BP: (!) 163/79 (!) 163/83  Pulse: 72 72  Resp: (!) 23 (!) 23  Temp: (!) 36.2 C (!) 36.2 C  SpO2: 98% 98%    Last Pain:  Vitals:   09/23/19 1915  TempSrc:   PainSc: 2                  Precious Haws Misti Towle

## 2019-09-29 ENCOUNTER — Encounter: Payer: Self-pay | Admitting: Surgery

## 2019-09-29 ENCOUNTER — Other Ambulatory Visit: Payer: Self-pay

## 2019-09-29 ENCOUNTER — Ambulatory Visit (INDEPENDENT_AMBULATORY_CARE_PROVIDER_SITE_OTHER): Payer: Commercial Managed Care - PPO | Admitting: Surgery

## 2019-09-29 VITALS — BP 152/87 | HR 75 | Temp 98.1°F | Resp 12 | Wt 267.4 lb

## 2019-09-29 DIAGNOSIS — Z09 Encounter for follow-up examination after completed treatment for conditions other than malignant neoplasm: Secondary | ICD-10-CM

## 2019-09-29 DIAGNOSIS — D171 Benign lipomatous neoplasm of skin and subcutaneous tissue of trunk: Secondary | ICD-10-CM

## 2019-09-29 NOTE — Patient Instructions (Addendum)
Begin to empty the drain repeatedly as the bulb gets full. Continue to keep track of the drain output as you are doing.  See your appointment below. Call the office if you have any questions or concerns.

## 2019-09-29 NOTE — Progress Notes (Signed)
09/29/2019  HPI: Natasha Contreras is a 59 y.o. female s/p excision of lower back lipomas on 09/23/19.  Drain was left in place due to the large size of the lipomas overall.  Pathology came back as lipoma, could possibly be an episacral lipoma, negative for malignancy.  Today the patient reports that she has been doing well, with no significant pain over the incision.  She did have some initial constipation but that has since resolved.  The only issue is that she has been emptying the drain once or twice a day only and the bulb has remained for without appropriate suction and a lot of fluid has been draining overall.  She reports that this fluid is serosanguineous in nature.  Vital signs: BP (!) 152/87    Pulse 75    Temp 98.1 F (36.7 C)    Resp 12    Wt 267 lb 6.4 oz (121.3 kg)    SpO2 97%    BMI 43.16 kg/m    Physical Exam: Constitutional: No acute distress Skin: Lower back incision is healing well and is clean, dry, and intact.  However there is a lot of fluid fluctuance underneath the wound.  The Blake drain was emptied for 380 mL in the office.  After that, the skin fluctuance had disappeared and it was more sunken and again.  Assessment/Plan: This is a 59 y.o. female s/p excision of lower back lipomas.  -Instructed the patient on how to empty the drain and record the output as needed perhaps multiple times per day in order to keep the bulb with good suction so that the layers of the skin and the subcutaneous tissue can scarred down together more easily.  She will keep better record and maintenance of the drain going forwards. --Discussed with her that the masses removed are indeed lipomas and not a large cyst as initially suspected.  I do not think these are episacral lipomas, as the mass was in the middle of her lower back, rather than on the sides were the nerves would penetrate the muscle.  Nonetheless, these are lipomas, and negative for malignancy. -Follow-up in 1 week to reassess and  possibly remove drain.   Melvyn Neth, [facility]

## 2019-10-06 ENCOUNTER — Ambulatory Visit (INDEPENDENT_AMBULATORY_CARE_PROVIDER_SITE_OTHER): Payer: Commercial Managed Care - PPO | Admitting: Surgery

## 2019-10-06 ENCOUNTER — Encounter: Payer: Self-pay | Admitting: Surgery

## 2019-10-06 ENCOUNTER — Other Ambulatory Visit: Payer: Self-pay

## 2019-10-06 VITALS — BP 110/81 | HR 82 | Temp 98.6°F | Resp 12 | Wt 267.8 lb

## 2019-10-06 DIAGNOSIS — D171 Benign lipomatous neoplasm of skin and subcutaneous tissue of trunk: Secondary | ICD-10-CM

## 2019-10-06 DIAGNOSIS — Z09 Encounter for follow-up examination after completed treatment for conditions other than malignant neoplasm: Secondary | ICD-10-CM

## 2019-10-06 MED ORDER — SULFAMETHOXAZOLE-TRIMETHOPRIM 400-80 MG PO TABS: 1.0000 | ORAL_TABLET | Freq: Two times a day (BID) | ORAL | 0 refills | Status: DC

## 2019-10-06 NOTE — Patient Instructions (Addendum)
Pick up your prescription at your local pharmacy.   Keep recording the output of the drainage. You will see Thedore Mins, PA at the [place] location.   See your appointment below. Call the office if you have any questions or concerns.

## 2019-10-06 NOTE — Progress Notes (Signed)
10/06/2019  HPI: Natasha Contreras is a 59 y.o. female s/p excision of lower back lipomas on 09/23/2019.  Drain was left in place due to the large size of the lipomas overall.  She was seen in the office last week on 9/13 during which time she was not keeping the drain to appropriate suction and a little fluid was still remaining.  We instructed her better on how to maintain the drain and she presents today for follow-up.  She reports that initially the drain was having a lot of output but then over the weekend he stopped draining fluid.  However she also feels that there is been more pressure on the backside some bloody fluid drainage.  She reports that when her husband sees the wound, there is some redness at the incision and around the drain.  Vital signs: BP 110/81   Pulse 82   Temp 98.6 F (37 C)   Resp 12   Wt 267 lb 12.8 oz (121.5 kg)   SpO2 98%   BMI 43.22 kg/m    Physical Exam: Constitutional: No acute distress Skin: Posterior midline incision has some mild surrounding erythema but otherwise the wound is intact with no drainage and healing well.  When evaluating the wound, the wound bed does look swollen with fluid and when looking at the drain, there is clot within the tubing.  The drain itself was stripped and immediately after approximately 320 mL of serosanguineous fluid was evacuated.  I reevaluated the wound, and the wound bed is more sunken in as it was last week after emptying fluid. Instructed the patient better about how to strip the drain so that this will continue to drain better  Assessment/Plan: This is a 59 y.o. female s/p excision of lower back lipomas on 09/23/2019.  -Instructed the patient better on how to strip the drain tubing itself.  Given that there is some mild erythema, 1 potential concern is that some of the fluid from the seroma was trying to get infected.  I will start the patient as a precaution on a 7-day course of Bactrim. -Patient will keep up with his drain  output and she will follow-up next week with Mr. Olean Ree in the office to reevaluate the wound and potentially remove the drain.  Discussed with the patient that we may still remove the drain even if the output is still high because it would have been at that point 3 weeks from surgery and I do not want that drain tract and wound cavity to get seeded with any skin bacteria.   Melvyn Neth, [facility]

## 2019-10-13 ENCOUNTER — Other Ambulatory Visit: Payer: Self-pay

## 2019-10-13 ENCOUNTER — Ambulatory Visit (INDEPENDENT_AMBULATORY_CARE_PROVIDER_SITE_OTHER): Payer: Commercial Managed Care - PPO | Admitting: Physician Assistant

## 2019-10-13 ENCOUNTER — Encounter: Payer: Self-pay | Admitting: Physician Assistant

## 2019-10-13 VITALS — BP 158/83 | HR 84 | Temp 97.9°F | Resp 12 | Ht 66.0 in | Wt 266.0 lb

## 2019-10-13 DIAGNOSIS — D171 Benign lipomatous neoplasm of skin and subcutaneous tissue of trunk: Secondary | ICD-10-CM

## 2019-10-13 DIAGNOSIS — Z09 Encounter for follow-up examination after completed treatment for conditions other than malignant neoplasm: Secondary | ICD-10-CM

## 2019-10-13 NOTE — Progress Notes (Signed)
[facility] POST-OP OFFICE VISIT  10/13/2019  HPI: Natasha Contreras is a 59 y.o. female 20 days s/p excision of lower back lipomas with Dr Hampton Abbot whose post-operative course has been complicated by large amounts of drainage and inadequate drain management at home for some time. Last seen on 09/20 and reviewed drain teaching/striping.   Today, she reports that this has been draining <10 ccs an hour. Sometimes the output varies depending on if she is moving a lot or not. This has remained serous. No purulence. No erythema around the drain. Only notices a "pulling sensation" in that area. No fever, chills. She has completed ABx. No other issues.   Vital signs: BP (!) 158/83   Pulse 84   Temp 97.9 F (36.6 C) (Oral)   Resp 12   Ht 5\' 6"  (1.676 m)   Wt 266 lb (120.7 kg)   SpO2 95%   BMI 42.93 kg/m    Physical Exam: Constitutional: Well appearing female, NAD Skin: incision to the lower back is healing well, there is a small area on the inferior portion of the wound which appears to have dehisced some superficially. No erythema or drainage. Drain is lateral to this on the right, output serous.   Assessment/Plan: This is a 59 y.o. female 20 days s/p excision of lower back lipomas   - I removed the drain today as the output is slowing down and she is ~3 weeks out to prevent tract formation. I placed dressing over this and reviewed wound care and aily changes. She understands signs and symptoms which would warrant more urgent return. No further ABx needed. She will follow up next week to ensure she continues to do well. She will call our office in the interim with any concerns.   -- Edison Simon, PA-C [facility] 10/13/2019, 11:12 AM [phone] M-F: 7am - 4pm

## 2019-10-13 NOTE — Patient Instructions (Signed)
We removed the drain today. Keep a dry dressing over the area. You may wash the area with soap and water and cover with a dry dressing.   Please see your follow up appointment listed below.

## 2019-10-22 ENCOUNTER — Encounter: Payer: Self-pay | Admitting: Physician Assistant

## 2019-10-22 ENCOUNTER — Other Ambulatory Visit: Payer: Self-pay

## 2019-10-22 ENCOUNTER — Telehealth: Payer: Self-pay | Admitting: *Deleted

## 2019-10-22 ENCOUNTER — Ambulatory Visit (INDEPENDENT_AMBULATORY_CARE_PROVIDER_SITE_OTHER): Payer: Commercial Managed Care - PPO | Admitting: Physician Assistant

## 2019-10-22 VITALS — BP 137/82 | HR 98 | Temp 98.6°F | Resp 14 | Ht 66.0 in | Wt 269.0 lb

## 2019-10-22 DIAGNOSIS — D171 Benign lipomatous neoplasm of skin and subcutaneous tissue of trunk: Secondary | ICD-10-CM

## 2019-10-22 DIAGNOSIS — Z09 Encounter for follow-up examination after completed treatment for conditions other than malignant neoplasm: Secondary | ICD-10-CM

## 2019-10-22 NOTE — Patient Instructions (Signed)
Please keep a dressing over the wound. We have applied silver nitrate-you will notice a dark grayish drainage today that is normal. Please keep a check  on your temperature-any reading of 101 or greater go to the emergency room.

## 2019-10-22 NOTE — Telephone Encounter (Signed)
Patient called back and now her fever is down to 99. She took two more Tylenol at 2 pm. Patient did not want to go to the ER. Patient instructed that if the temperature continued to go up to 101 or higher she should go to the ER as she may need a scan to see what the cause may be. She is amendable to this.

## 2019-10-22 NOTE — Telephone Encounter (Signed)
Message left for the patient to call back.  Per her aftercare instructions she was told to report to the ER for any temperatures of 101 or higher.

## 2019-10-22 NOTE — Progress Notes (Signed)
[facility] POST-OP OFFICE VISIT  10/22/2019  HPI: Natasha Contreras is a 59 y.o. female ~1 month s/p excision of lower back lipomas with Dr Hampton Abbot whose post-operative course has been complicated by large amounts of drainage which slowed and drain removed on 09/27  She comes into today secondary to complaints of new onset chills this morning at 3 AM and large amount of serous output from inferior portion of the wound. Prior to this she had been doing well and had no issues with the wound nor drainage. She has not measured her temperature at home and was afebrile here. Only other complaints is fatigue. No cough, congestion, CP, SOB, abdominal pain, or urinary changes. No other complaints.   Vital signs: BP 137/82   Pulse 98   Temp 98.6 F (37 C)   Resp 14   Ht 5\' 6"  (1.676 m)   Wt 269 lb (122 kg)   SpO2 97%   BMI 43.42 kg/m    Physical Exam: Constitutional: Patient lying in left lateral decubitus position, wrapped in blanket, shivering Skin: Incision to lower medial back and superior gluteal cleft is healing well, there is an area of superficial skin dehisence to the inferior last 2 cm of the wound, there is fibrinous exudate in the wound bed which I removed. She did have some serous drainage from the area, the wound was <5 mm when probed with q-tip. No surround erythema or tenderness. Previous drain site to right superior buttock is healed, no erythema or drainage.    Assessment/Plan: This is a 59 y.o. female ~1 month s/p excision of lower back lipomas   - I am unsure the source of her chills. She is afebrile, hemodynamically stable, and otherwise without symptoms. I encouraged her to monitor her temperature at home, and she may benefit from evaluation from her PCP/ED should symptoms worsen. I do NOT believe her lower back incision is infected at this time, and I am not sure there is any benefit to ABx at this time.   - I did apply silver nitrate to her wound bed to help  manage the drain, reviewed wound care with her  - No further intervention at this time  - I will follow up again next week to ensure wound continues to improve. She was advised to call with questions/concerns in the interim.   -- Edison Simon, PA-C [facility] 10/22/2019, 10:03 AM [phone] M-F: 7am - 4pm

## 2019-10-22 NOTE — Telephone Encounter (Signed)
Patient called and stated that she just took her temperature and it was 101.3. She took 2 tylenol at 10:15am

## 2019-10-23 ENCOUNTER — Encounter: Payer: Self-pay | Admitting: Physician Assistant

## 2019-10-30 ENCOUNTER — Encounter: Payer: Self-pay | Admitting: Physician Assistant

## 2019-10-30 ENCOUNTER — Other Ambulatory Visit: Payer: Self-pay

## 2019-10-30 ENCOUNTER — Ambulatory Visit (INDEPENDENT_AMBULATORY_CARE_PROVIDER_SITE_OTHER): Payer: Commercial Managed Care - PPO | Admitting: Physician Assistant

## 2019-10-30 VITALS — BP 128/68 | HR 84 | Temp 98.7°F | Ht 66.0 in | Wt 265.4 lb

## 2019-10-30 DIAGNOSIS — Z09 Encounter for follow-up examination after completed treatment for conditions other than malignant neoplasm: Secondary | ICD-10-CM

## 2019-10-30 NOTE — Patient Instructions (Signed)
Patient advised to keep scheduled appointment for Wednesday, and patient advised if she is feeling ok, she may contact the office and we can move her appointment back for a week.  Wound Care, Adult Taking care of your wound properly can help to prevent pain, infection, and scarring. It can also help your wound to heal more quickly. How to care for your wound Wound care      Follow instructions from your health care provider about how to take care of your wound. Make sure you: ? Wash your hands with soap and water before you change the bandage (dressing). If soap and water are not available, use hand sanitizer. ? Change your dressing as told by your health care provider. ? Leave stitches (sutures), skin glue, or adhesive strips in place. These skin closures may need to stay in place for 2 weeks or longer. If adhesive strip edges start to loosen and curl up, you may trim the loose edges. Do not remove adhesive strips completely unless your health care provider tells you to do that.  Check your wound area every day for signs of infection. Check for: ? Redness, swelling, or pain. ? Fluid or blood. ? Warmth. ? Pus or a bad smell.  Ask your health care provider if you should clean the wound with mild soap and water. Doing this may include: ? Using a clean towel to pat the wound dry after cleaning it. Do not rub or scrub the wound. ? Applying a cream or ointment. Do this only as told by your health care provider. ? Covering the incision with a clean dressing.  Ask your health care provider when you can leave the wound uncovered.  Keep the dressing dry until your health care provider says it can be removed. Do not take baths, swim, use a hot tub, or do anything that would put the wound underwater until your health care provider approves. Ask your health care provider if you can take showers. You may only be allowed to take sponge baths. Medicines   If you were prescribed an antibiotic  medicine, cream, or ointment, take or use the antibiotic as told by your health care provider. Do not stop taking or using the antibiotic even if your condition improves.  Take over-the-counter and prescription medicines only as told by your health care provider. If you were prescribed pain medicine, take it 30 or more minutes before you do any wound care or as told by your health care provider. General instructions  Return to your normal activities as told by your health care provider. Ask your health care provider what activities are safe.  Do not scratch or pick at the wound.  Do not use any products that contain nicotine or tobacco, such as cigarettes and e-cigarettes. These may delay wound healing. If you need help quitting, ask your health care provider.  Keep all follow-up visits as told by your health care provider. This is important.  Eat a diet that includes protein, vitamin A, vitamin C, and other nutrient-rich foods to help the wound heal. ? Foods rich in protein include meat, dairy, beans, nuts, and other sources. ? Foods rich in vitamin A include carrots and dark green, leafy vegetables. ? Foods rich in vitamin C include citrus, tomatoes, and other fruits and vegetables. ? Nutrient-rich foods have protein, carbohydrates, fat, vitamins, or minerals. Eat a variety of healthy foods including vegetables, fruits, and whole grains. Contact a health care provider if:  You received a tetanus shot  and you have swelling, severe pain, redness, or bleeding at the injection site.  Your pain is not controlled with medicine.  You have redness, swelling, or pain around the wound.  You have fluid or blood coming from the wound.  Your wound feels warm to the touch.  You have pus or a bad smell coming from the wound.  You have a fever or chills.  You are nauseous or you vomit.  You are dizzy. Get help right away if:  You have a red streak going away from your wound.  The edges of  the wound open up and separate.  Your wound is bleeding, and the bleeding does not stop with gentle pressure.  You have a rash.  You faint.  You have trouble breathing. Summary  Always wash your hands with soap and water before changing your bandage (dressing).  To help with healing, eat foods that are rich in protein, vitamin A, vitamin C, and other nutrients.  Check your wound every day for signs of infection. Contact your health care provider if you suspect that your wound is infected. This information is not intended to replace advice given to you by your health care provider. Make sure you discuss any questions you have with your health care provider. Document Revised: 04/22/2018 Document Reviewed: 07/20/2015 Elsevier Patient Education  [place].

## 2019-10-30 NOTE — Progress Notes (Signed)
[facility] POST-OP OFFICE VISIT  10/30/2019  HPI: Natasha Contreras is a 59 y.o. female ~1.5 months s/p excision of lower back lipomas with Dr Hampton Abbot whose post-operative course has been complicated by large amounts of drainage.  She presents today secondary to copious amounts of serous fluid from her wound over the last few days. She reports that she soaks through multiple pads a day. No fever or chills. No evidence of purulent drainage. No other complaints.    Vital signs: BP 128/68    Pulse 84    Temp 98.7 F (37.1 C) (Oral)    Ht 5\' 6"  (1.676 m)    Wt 265 lb 6.4 oz (120.4 kg)    SpO2 95%    BMI 42.84 kg/m    Physical Exam: Constitutional: Well appearing female, NAD Skin: The inferior portion to her incision to her medial lower back had dehisced and there was fibrinous tissue present. I debride this sharply and revealed a large space which was 100% granular tissue. There was serous fluid present. No evidence of purulence. The surrounding skin was without significant erythema. The drain site had healed well.   Assessment/Plan: This is a 59 y.o. female  ~1.5 months s/p excision of lower back lipomas   - I reviewed wound care with the patient in detail. Wound was packed with saline moistened Kerlix gauze and secured with dry gauze, ABD pad, and tape. She understands this needs to be done daily. 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 She understands this may take ~2 months or so to heal completely.   - I do not think she warrants Abx at this time.   - She will follow up weekly or bi-weekly as needed. I will be happy to see her sooner for any acute issues.   -- Edison Simon, PA-C [facility] 10/30/2019, 10:16 AM [phone] M-F: 7am - 4pm

## 2019-11-05 ENCOUNTER — Ambulatory Visit (INDEPENDENT_AMBULATORY_CARE_PROVIDER_SITE_OTHER): Payer: Commercial Managed Care - PPO | Admitting: Surgery

## 2019-11-05 ENCOUNTER — Encounter: Payer: Self-pay | Admitting: Surgery

## 2019-11-05 ENCOUNTER — Other Ambulatory Visit: Payer: Self-pay

## 2019-11-05 DIAGNOSIS — T8131XD Disruption of external operation (surgical) wound, not elsewhere classified, subsequent encounter: Secondary | ICD-10-CM

## 2019-11-05 DIAGNOSIS — D171 Benign lipomatous neoplasm of skin and subcutaneous tissue of trunk: Secondary | ICD-10-CM

## 2019-11-05 DIAGNOSIS — Z09 Encounter for follow-up examination after completed treatment for conditions other than malignant neoplasm: Secondary | ICD-10-CM

## 2019-11-05 NOTE — Patient Instructions (Addendum)
Dr.Piscoya suggest patient to change dressing when she feels the gauze has become saturated to preventing infection.  Patient advised to make sure the gauze is completely in the wound.   Wound Care, Adult Taking care of your wound properly can help to prevent pain, infection, and scarring. It can also help your wound to heal more quickly. How to care for your wound Wound care      Follow instructions from your health care provider about how to take care of your wound. Make sure you: ? Wash your hands with soap and water before you change the bandage (dressing). If soap and water are not available, use hand sanitizer. ? Change your dressing as told by your health care provider. ? Leave stitches (sutures), skin glue, or adhesive strips in place. These skin closures may need to stay in place for 2 weeks or longer. If adhesive strip edges start to loosen and curl up, you may trim the loose edges. Do not remove adhesive strips completely unless your health care provider tells you to do that.  Check your wound area every day for signs of infection. Check for: ? Redness, swelling, or pain. ? Fluid or blood. ? Warmth. ? Pus or a bad smell.  Ask your health care provider if you should clean the wound with mild soap and water. Doing this may include: ? Using a clean towel to pat the wound dry after cleaning it. Do not rub or scrub the wound. ? Applying a cream or ointment. Do this only as told by your health care provider. ? Covering the incision with a clean dressing.  Ask your health care provider when you can leave the wound uncovered.  Keep the dressing dry until your health care provider says it can be removed. Do not take baths, swim, use a hot tub, or do anything that would put the wound underwater until your health care provider approves. Ask your health care provider if you can take showers. You may only be allowed to take sponge baths. Medicines   If you were prescribed an antibiotic  medicine, cream, or ointment, take or use the antibiotic as told by your health care provider. Do not stop taking or using the antibiotic even if your condition improves.  Take over-the-counter and prescription medicines only as told by your health care provider. If you were prescribed pain medicine, take it 30 or more minutes before you do any wound care or as told by your health care provider. General instructions  Return to your normal activities as told by your health care provider. Ask your health care provider what activities are safe.  Do not scratch or pick at the wound.  Do not use any products that contain nicotine or tobacco, such as cigarettes and e-cigarettes. These may delay wound healing. If you need help quitting, ask your health care provider.  Keep all follow-up visits as told by your health care provider. This is important.  Eat a diet that includes protein, vitamin A, vitamin C, and other nutrient-rich foods to help the wound heal. ? Foods rich in protein include meat, dairy, beans, nuts, and other sources. ? Foods rich in vitamin A include carrots and dark green, leafy vegetables. ? Foods rich in vitamin C include citrus, tomatoes, and other fruits and vegetables. ? Nutrient-rich foods have protein, carbohydrates, fat, vitamins, or minerals. Eat a variety of healthy foods including vegetables, fruits, and whole grains. Contact a health care provider if:  You received a tetanus shot  and you have swelling, severe pain, redness, or bleeding at the injection site.  Your pain is not controlled with medicine.  You have redness, swelling, or pain around the wound.  You have fluid or blood coming from the wound.  Your wound feels warm to the touch.  You have pus or a bad smell coming from the wound.  You have a fever or chills.  You are nauseous or you vomit.  You are dizzy. Get help right away if:  You have a red streak going away from your wound.  The edges of  the wound open up and separate.  Your wound is bleeding, and the bleeding does not stop with gentle pressure.  You have a rash.  You faint.  You have trouble breathing. Summary  Always wash your hands with soap and water before changing your bandage (dressing).  To help with healing, eat foods that are rich in protein, vitamin A, vitamin C, and other nutrients.  Check your wound every day for signs of infection. Contact your health care provider if you suspect that your wound is infected. This information is not intended to replace advice given to you by your health care provider. Make sure you discuss any questions you have with your health care provider. Document Revised: 04/22/2018 Document Reviewed: 07/20/2015 Elsevier Patient Education  [place].

## 2019-11-05 NOTE — Progress Notes (Signed)
11/05/2019  HPI: Natasha Contreras is a 59 y.o. female s/p excision of a large lipoma cluster in the back on 09/23/2019.  She was seen in the office on 10/14 because her wound had opened and was draining copious amount of serous fluid.  The wound is now being packed with Kerlix gauze at home by her husband and friends and she presents today for follow-up.  She reports she has been doing well and feels that the wound is getting smaller.  Denies any worsening pain, redness, or purulent drainage.  Reports that she still having significant drainage still that does soak through dressings.  Vital signs: There were no vitals taken for this visit.   Physical Exam: Constitutional: No acute distress Skin: Lower back incision is open for about 7 cm x 3 cm.  The inside of the cavity measures about 10 cm x 10 cm.  The wound bed is healing well with very healthy granulation tissue throughout.  There is no evidence of purulence or necrosis or fibrinous material.  The wound was packed with moist Kerlix gauze dressed with dry gauze and ABD pad and tape.  Assessment/Plan: This is a 59 y.o. female s/p excision of large lipoma cluster in the lower back.  -Instructed patient to continue doing wet-to-dry dressing changes.  She may change the outer gauze as needed in order to prevent over saturation that could get to her clothes. -Follow-up in 2 weeks.   Melvyn Neth, [facility]

## 2019-11-19 ENCOUNTER — Ambulatory Visit (INDEPENDENT_AMBULATORY_CARE_PROVIDER_SITE_OTHER): Payer: Commercial Managed Care - PPO | Admitting: Surgery

## 2019-11-19 ENCOUNTER — Other Ambulatory Visit: Payer: Self-pay

## 2019-11-19 ENCOUNTER — Encounter: Payer: Self-pay | Admitting: Surgery

## 2019-11-19 ENCOUNTER — Encounter: Payer: Commercial Managed Care - PPO | Admitting: Surgery

## 2019-11-19 VITALS — BP 152/93 | HR 84 | Temp 98.9°F | Resp 12 | Wt 267.4 lb

## 2019-11-19 DIAGNOSIS — Z09 Encounter for follow-up examination after completed treatment for conditions other than malignant neoplasm: Secondary | ICD-10-CM

## 2019-11-19 DIAGNOSIS — T8131XD Disruption of external operation (surgical) wound, not elsewhere classified, subsequent encounter: Secondary | ICD-10-CM

## 2019-11-19 NOTE — Patient Instructions (Signed)
Continue to pack the area once a day. Put the gauze in the wound tightly and get all corners in.   See your appointment below. Call the office if you have any questions or concerns.

## 2019-11-20 ENCOUNTER — Encounter: Payer: Self-pay | Admitting: Surgery

## 2019-11-20 NOTE — Progress Notes (Signed)
11/19/2019  HPI: Natasha Contreras is a 59 y.o. 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  She had wound dehiscence and has been getting wet to dry packing dressing changes daily.  She presents for follow up.  Reports that there was some green colored drainage on her gauze recently and that the wound has become a bit more sore recently.  Vital signs: BP (!) 152/93   Pulse 84   Temp 98.9 F (37.2 C)   Resp 12   Wt 267 lb 6.4 oz (121.3 kg)   SpO2 96%   BMI 43.16 kg/m    Physical Exam: Constitutional:  No acute distress Skin:  Patient's lower back wound is open to about 3 x 5 cm external size, with cavity of about 10 x 8 cm.  The wound bed is healthy with good granulation tissue, there was some sloughing fibrinous material at the deep edges of the cavity, but no purulence or foul odor.  No evidence of infection.  Assessment/Plan: This is a 59 y.o. 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  --The patient's wound is still healthy and healing well, without any evidence of infection.  The green material she saw was probably fibrinous slough from the wound bed, which could be due to insufficient packing of the wound.  Instructed her that the wound needs to be packed better so the slough can be better removed with each dressing change.  Otherwise continue daily wet to dry dressing changes.  No antibiotics or debridements needed. --Follow up in two weeks.   Melvyn Neth, [facility]

## 2019-12-03 ENCOUNTER — Encounter: Payer: Commercial Managed Care - PPO | Admitting: Surgery

## 2019-12-05 ENCOUNTER — Ambulatory Visit (INDEPENDENT_AMBULATORY_CARE_PROVIDER_SITE_OTHER): Payer: Commercial Managed Care - PPO | Admitting: Surgery

## 2019-12-05 ENCOUNTER — Encounter: Payer: Self-pay | Admitting: Surgery

## 2019-12-05 ENCOUNTER — Other Ambulatory Visit: Payer: Self-pay

## 2019-12-05 VITALS — BP 154/83 | HR 77 | Temp 98.3°F | Ht 66.0 in | Wt 264.0 lb

## 2019-12-05 DIAGNOSIS — T8131XD Disruption of external operation (surgical) wound, not elsewhere classified, subsequent encounter: Secondary | ICD-10-CM

## 2019-12-05 DIAGNOSIS — Z09 Encounter for follow-up examination after completed treatment for conditions other than malignant neoplasm: Secondary | ICD-10-CM

## 2019-12-05 NOTE — Patient Instructions (Signed)
Continue to do daily packing. May start using less padding if drainage is decreasing. Rotate the tape.    Follow up here in 3 weeks.

## 2019-12-05 NOTE — Progress Notes (Signed)
12/05/2019  HPI: Natasha Contreras is a 59 y.o. female s/p excision of large lipoma cluster in the lower back on 09/23/19.  This was complicated by wound dehiscence requiring wet to dry packing dressing changes.  She presents today for follow up.  The wound continues to heal well.  Denies any worsening pain and believe the wound is smaller and is having less drainage.  Vital signs: BP (!) 154/83   Pulse 77   Temp 98.3 F (36.8 C)   Ht 5\' 6"  (1.676 m)   Wt 264 lb (119.7 kg)   SpO2 98%   BMI 42.61 kg/m    Physical Exam: Constitutional:  No acute distress Skin:  Lower back wound is healing well, with healthy granulation tissue throughout and no fibrinous material.  Smaller today compared to last visit.  Packed with wet to dry dressing.  Assessment/Plan: This is a 59 y.o. female s/p excision of large lipoma cluster in the lower back.  --Patient is healing well, with improved size of the wound today.  Continue wet to dry dressing changes. --Follow up in three weeks.   Melvyn Neth, [facility]

## 2019-12-16 ENCOUNTER — Other Ambulatory Visit: Payer: Self-pay | Admitting: Medical Oncology

## 2019-12-16 DIAGNOSIS — Z1231 Encounter for screening mammogram for malignant neoplasm of breast: Secondary | ICD-10-CM

## 2019-12-21 IMAGING — MG MM DIGITAL SCREENING BILAT W/ CAD
6 series · 6 of 6 positions shown · non-contrast
Comparison: Previous exam(s).

CLINICAL DATA: Screening.

EXAM:
DIGITAL SCREENING BILATERAL MAMMOGRAM WITH CAD

[R MLO (1 of 2)]
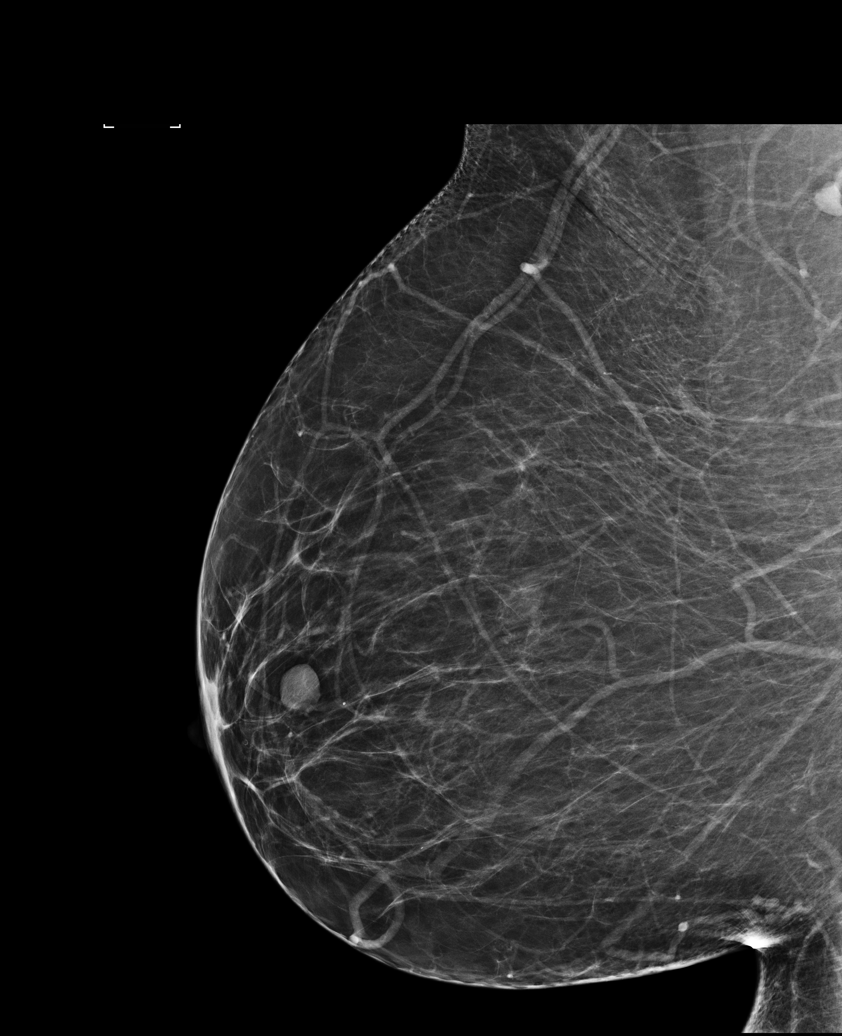

[L CC]
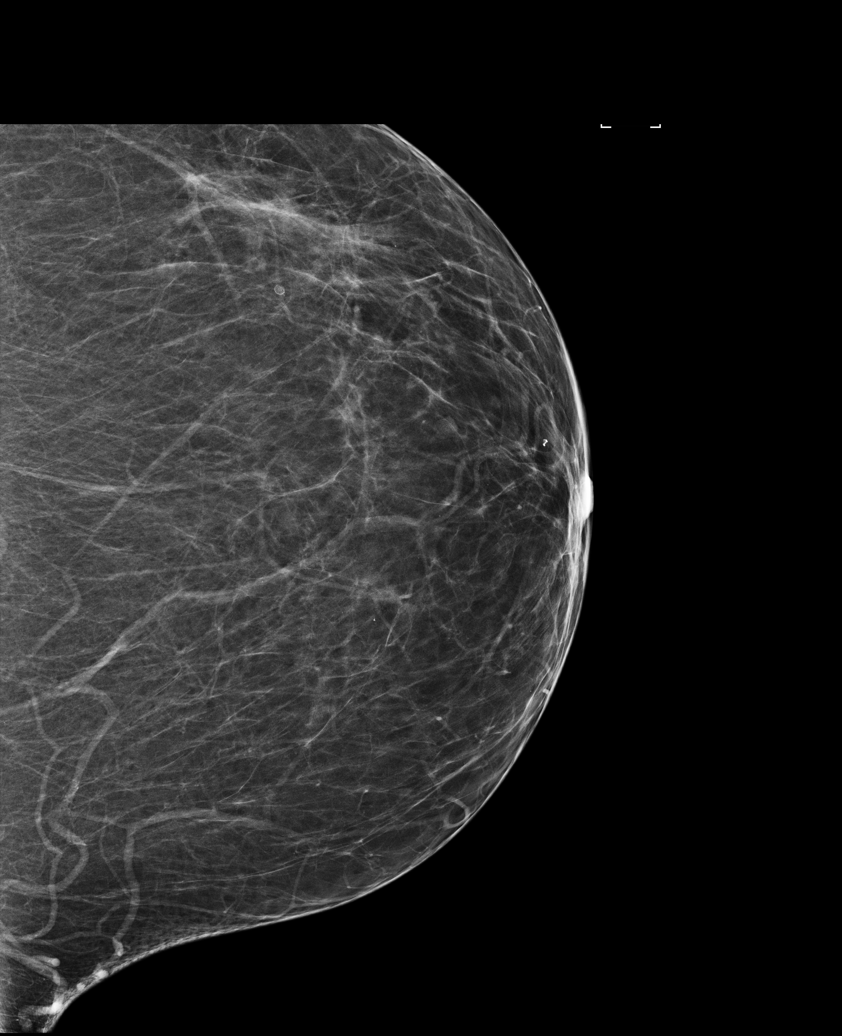

[R CC (1 of 2)]
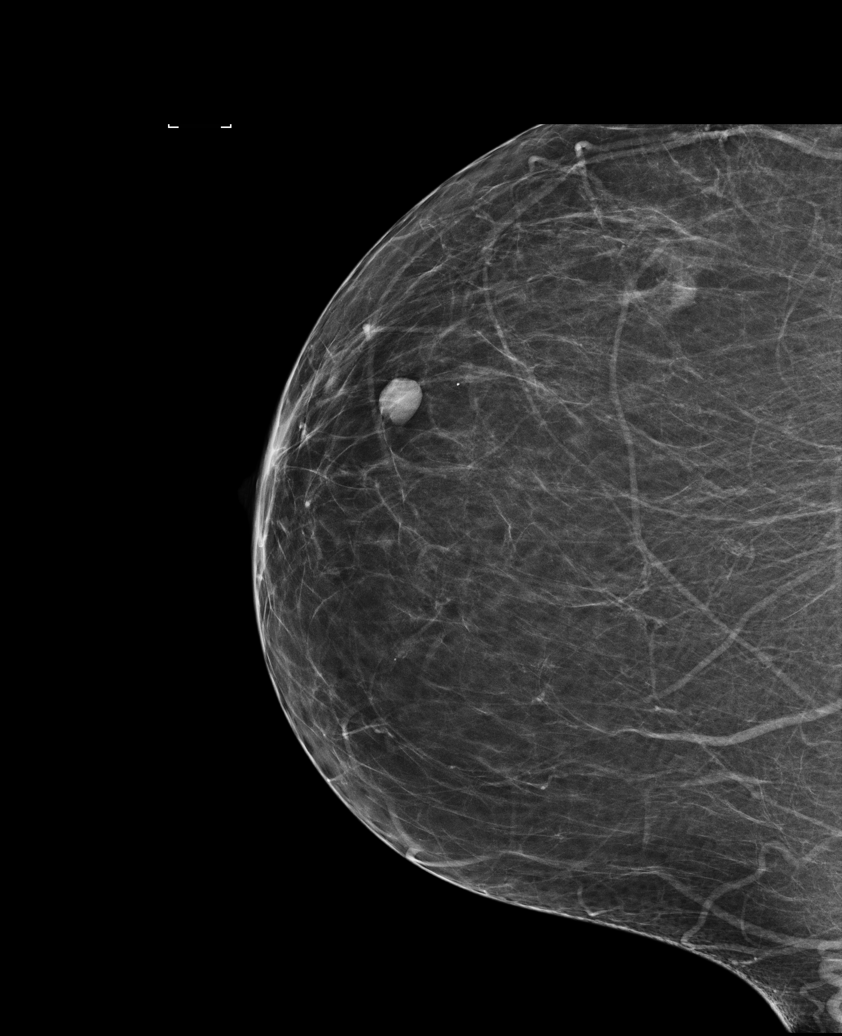

[L MLO]
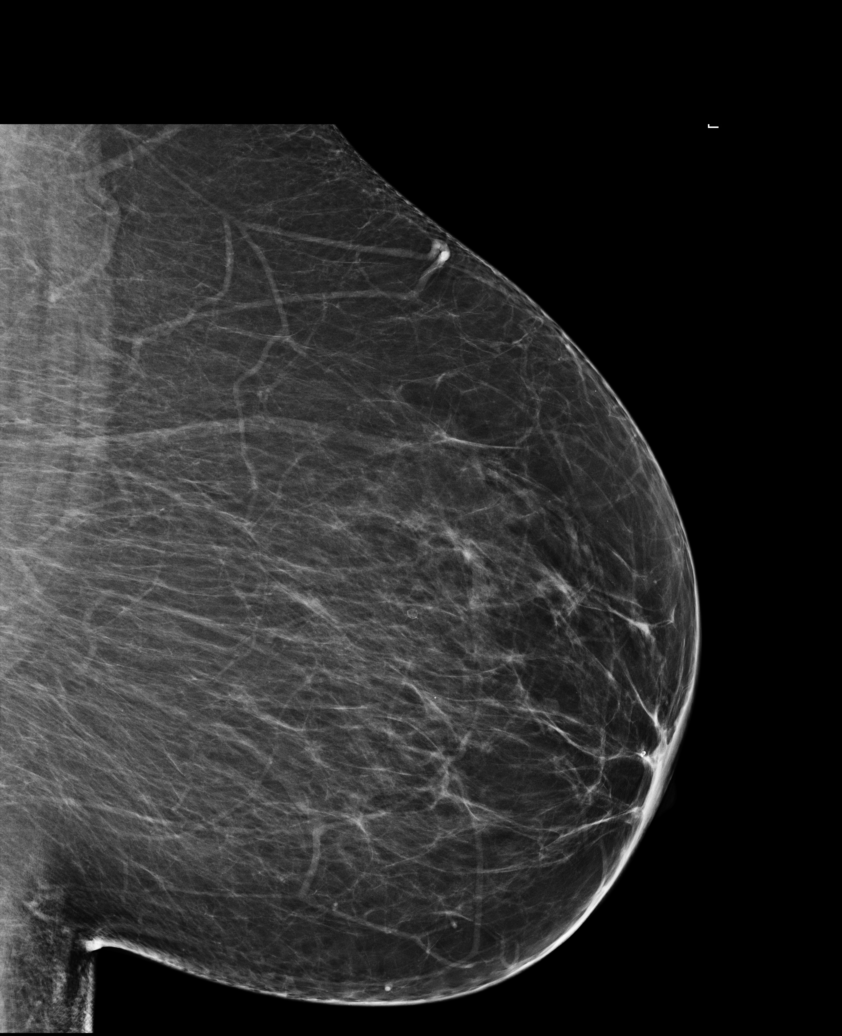

[R MLO (2 of 2)]
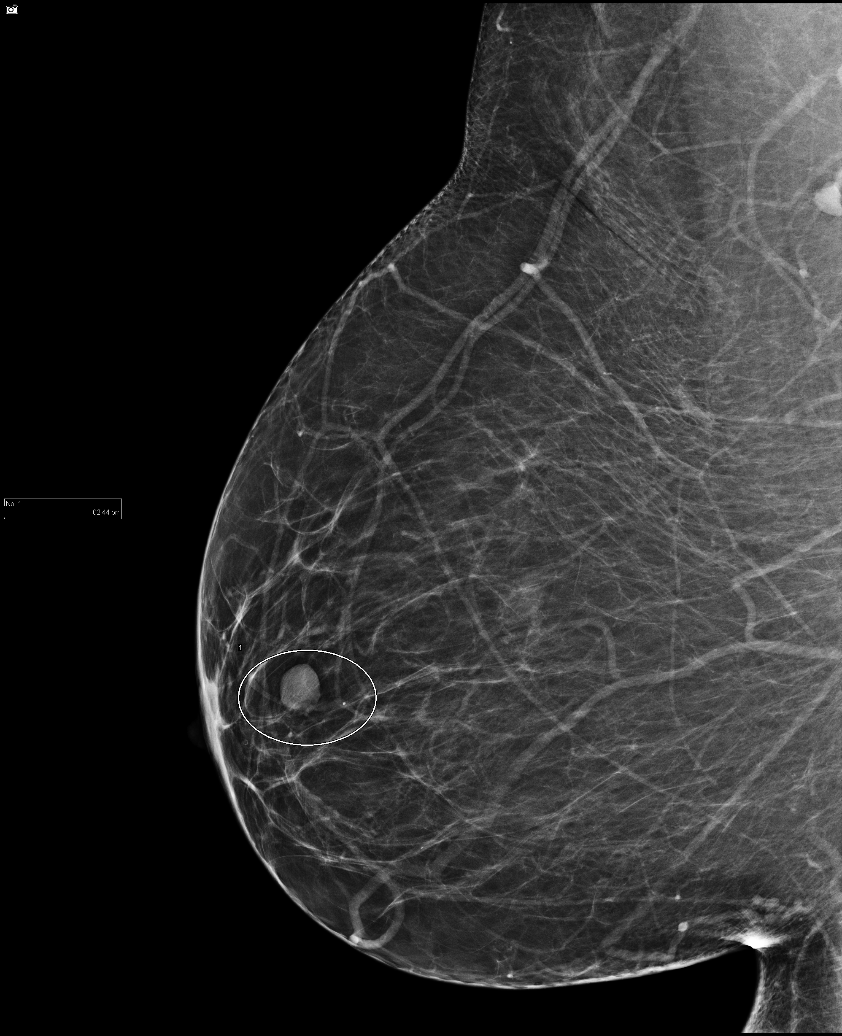

[R CC (2 of 2)]
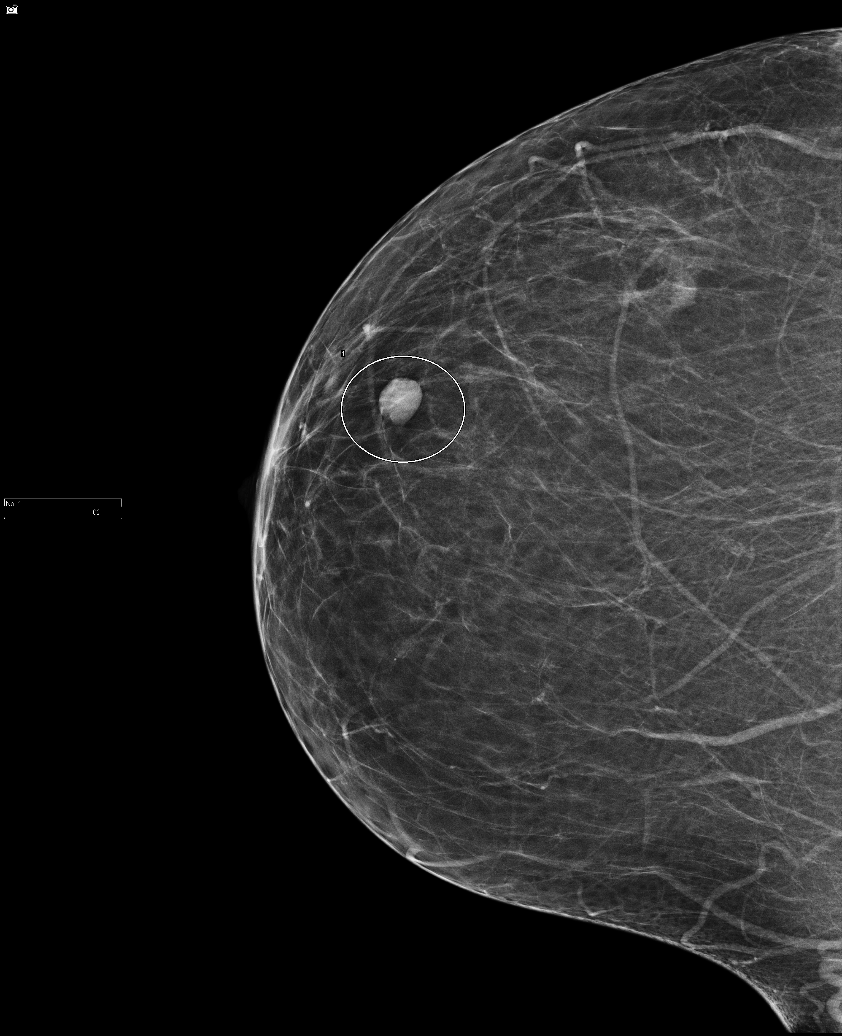

[6 of 6 positions shown; findings below may reference images not displayed]

ACR Breast Density Category b: There are scattered areas of
fibroglandular density.
FINDINGS: In the right breast, a possible mass warrants further evaluation. In
the left breast, no findings suspicious for malignancy. Images were
processed with CAD.
IMPRESSION: Further evaluation is suggested for possible mass in the right
breast.

RECOMMENDATION:
Diagnostic mammogram and possibly ultrasound of the right breast.
(Code:[id number])

The patient will be contacted regarding the findings, and additional
imaging will be scheduled.

BI-RADS CATEGORY  0: Incomplete. Need additional imaging evaluation
and/or prior mammograms for comparison.

## 2019-12-29 ENCOUNTER — Encounter: Payer: Commercial Managed Care - PPO | Admitting: Surgery

## 2019-12-31 ENCOUNTER — Encounter: Payer: Self-pay | Admitting: Surgery

## 2019-12-31 ENCOUNTER — Ambulatory Visit (INDEPENDENT_AMBULATORY_CARE_PROVIDER_SITE_OTHER): Payer: Commercial Managed Care - PPO | Admitting: Surgery

## 2019-12-31 ENCOUNTER — Other Ambulatory Visit: Payer: Self-pay

## 2019-12-31 VITALS — BP 134/78 | HR 87 | Temp 98.1°F | Ht 66.0 in | Wt 266.0 lb

## 2019-12-31 DIAGNOSIS — T8131XD Disruption of external operation (surgical) wound, not elsewhere classified, subsequent encounter: Secondary | ICD-10-CM

## 2019-12-31 NOTE — Progress Notes (Signed)
12/31/2019  HPI: ANNEBELLE BOSTIC is a 59 y.o. female s/p excision of large lipoma cluster in the lower back on 09/23/2019.  She had wound dehiscence with a large wound cavity that has been packed since about October.  He presents today for follow-up.  She appears very frustrated and is "done" with having to do dressing changes.  Vital signs: BP 134/78   Pulse 87   Temp 98.1 F (36.7 C) (Oral)   Ht 5\' 6"  (1.676 m)   Wt 266 lb (120.7 kg)   SpO2 96%   BMI 42.93 kg/m    Physical Exam: Constitutional: No acute distress Skin: Lower back wound is healing quite well with very healthy granulation tissue.  The wound is much smaller now and measures about 2.5 cm in diameter.  There is no further undermining. Wound packed with wet to dry gauze dressing.  Assessment/Plan: This is a 59 y.o. female s/p excision of large lipoma cluster of the lower back.  --Patient appears very frustrated with how long the wound is taking to close.  She can see that it is smaller, but she's just tired of it.  I encouraged her to continue wet to dry dressing changes, as the wound is doing really well at this point.   --She wants no further follow up appointments with [place].  She's welcome to call us if any issues or concerns.   Melvyn Neth, [facility]

## 2020-02-02 ENCOUNTER — Ambulatory Visit: Payer: Commercial Managed Care - PPO

## 2020-02-05 ENCOUNTER — Other Ambulatory Visit: Payer: Self-pay

## 2020-02-05 ENCOUNTER — Ambulatory Visit
Admission: RE | Admit: 2020-02-05 | Discharge: 2020-02-05 | Disposition: A | Discharge status: Home / Self Care | Payer: Commercial Managed Care - PPO | Source: Ambulatory Visit | Attending: Medical Oncology | Admitting: Medical Oncology

## 2020-02-05 DIAGNOSIS — Z1231 Encounter for screening mammogram for malignant neoplasm of breast: Secondary | ICD-10-CM | POA: Diagnosis not present

## 2020-02-20 ENCOUNTER — Encounter: Payer: Self-pay | Admitting: Surgery

## 2020-02-20 ENCOUNTER — Ambulatory Visit (INDEPENDENT_AMBULATORY_CARE_PROVIDER_SITE_OTHER): Payer: Commercial Managed Care - PPO | Admitting: Surgery

## 2020-02-20 ENCOUNTER — Other Ambulatory Visit: Payer: Self-pay

## 2020-02-20 VITALS — BP 142/95 | HR 82 | Temp 98.0°F | Ht 66.0 in | Wt 265.8 lb

## 2020-02-20 DIAGNOSIS — T8131XD Disruption of external operation (surgical) wound, not elsewhere classified, subsequent encounter: Secondary | ICD-10-CM | POA: Diagnosis not present

## 2020-02-20 DIAGNOSIS — D171 Benign lipomatous neoplasm of skin and subcutaneous tissue of trunk: Secondary | ICD-10-CM

## 2020-02-20 DIAGNOSIS — Z09 Encounter for follow-up examination after completed treatment for conditions other than malignant neoplasm: Secondary | ICD-10-CM

## 2020-02-20 NOTE — Patient Instructions (Signed)
Continue using the ointment on the irritated rash. If you have any concerns or questions, please feel free to call our office.

## 2020-02-22 ENCOUNTER — Encounter: Payer: Self-pay | Admitting: Surgery

## 2020-02-22 NOTE — Progress Notes (Addendum)
02/20/2020  History of Present Illness: Natasha Contreras is a 60 y.o. female s/p excision of lower back cluster of lipomas on 09/23/19.  She was last seen in December 2021 and she did not want further follow ups as she was frustrated that the wound had not fully healed yet.    Today she presents because there was concern for possible wound infection.  She noticed that she developed a rash around the incision consistent with the bandages she was using, and when she was evaluated at the PCP's office, it was noted that she had a scab vs "crusty" material over the wound base.  She also felt like the area was warmer.  She reports that a friend of hers has been helping with wound packing, though it's not done everyday.  Her husband is no longer helping.  Past Medical History: Past Medical History:  Diagnosis Date  . Anemia   . Arthritis   . Diabetes mellitus, type 2 ([place])    in the past  . GERD (gastroesophageal reflux disease)   . Hypertension   . Sleep apnea      Past Surgical History: Past Surgical History:  Procedure Laterality Date  . ABDOMINAL HYSTERECTOMY    . APPENDECTOMY    . BREAST BIOPSY Right 05/2017   neg  . EXCISION OF BACK LESION N/A 09/23/2019   Procedure: EXCISION OF BACK LESION;  Surgeon: Olean Ree, MD;  Location: [facility] ORS;  Service: General;  Laterality: N/A;  . GASTRIC BYPASS  2014  . LAPAROSCOPIC CHOLECYSTECTOMY      Home Medications: Prior to Admission medications   Medication Sig Start Date End Date Taking? Authorizing Provider  amLODipine (NORVASC) 2.5 MG tablet Take 2.5 mg by mouth at bedtime.   Yes [provider]  Cholecalciferol (VITAMIN D) 125 MCG (5000 UT) CAPS Take 5,000 Units by mouth daily.   Yes [provider]  docusate sodium (COLACE) 100 MG capsule Take 100 mg by mouth daily as needed for mild constipation.   Yes [provider]  doxycycline (VIBRAMYCIN) 100 MG capsule Take 100 mg by mouth 2 (two) times daily.   Yes  [provider]  ferrous sulfate 325 (65 FE) MG tablet Take 325 mg by mouth daily with breakfast.   Yes [provider]  ibuprofen (ADVIL) 600 MG tablet Take 1 tablet (600 mg total) by mouth every 8 (eight) hours as needed for mild pain or moderate pain. 09/23/19  Yes Dodge Ator, Jacqulyn Bath, MD  Multiple Vitamin (MULTIVITAMIN WITH MINERALS) TABS tablet Take 1 tablet by mouth daily.   Yes [provider]  mupirocin ointment (BACTROBAN) 2 % 1 application 2 (two) times daily.   Yes [provider]  omeprazole (PRILOSEC) 20 MG capsule Take 20 mg by mouth daily.   Yes [provider]  rosuvastatin (CRESTOR) 10 MG tablet Take 10 mg by mouth daily.   Yes [provider]    Allergies: Allergies  Allergen Reactions  . Clindamycin Swelling    Tongue swelling  . Penicillins Swelling    Review of Systems: Review of Systems  Constitutional: Negative for chills and fever.  Respiratory: Negative for shortness of breath.   Cardiovascular: Negative for chest pain.  Skin: Positive for rash.    Physical Exam BP (!) 142/95   Pulse 82   Temp 98 F (36.7 C) (Oral)   Ht 5\' 6"  (1.676 m)   Wt 265 lb 12.8 oz (120.6 kg)   SpO2 97%   BMI 42.90  kg/m  CONSTITUTIONAL: No acute distress RESPIRATORY:  Normal respiratory effort without pathologic use of accessory muscles. CARDIOVASCULAR: regular rhythm and rate. SKIN: lower back/sacral incision is overall heaing well, and now measures about 2 cm in size.  The wound base is healthy, with some fibrinous material which was easiliy cleaned with a qtip.  Dressed with moist to dry dressing. NEUROLOGIC:  Motor and sensation is grossly normal.  Cranial nerves are grossly intact. PSYCH:  Alert and oriented to person, place and time. Affect is normal.  Assessment and Plan: This is a 60 y.o. female s/p excision of lower back cluster of lipomas  --Wound is healing well, no evidence of infection.  Recommended that she  continue with wet to dry dressing changes, and on those days that her friend cannot help her, at least she should do a dry dressing change, so no fibrinous material can accumulate and there is less sweat on the wound. --Follow up as needed.  Face-to-face time spent with the patient and care providers was 15 minutes, with more than 50% of the time spent counseling, educating, and coordinating care of the patient.     Melvyn Neth, [facility]

## 2020-09-04 ENCOUNTER — Encounter: Payer: Self-pay | Admitting: Surgery

## 2020-09-29 ENCOUNTER — Encounter: Payer: Self-pay | Admitting: *Deleted

## 2020-09-30 ENCOUNTER — Encounter
Admission: RE | Disposition: A | Discharge status: Home / Self Care | Payer: Self-pay | Source: Home / Self Care | Attending: Gastroenterology

## 2020-09-30 ENCOUNTER — Ambulatory Visit
Admission: RE | Admit: 2020-09-30 | Discharge: 2020-09-30 | Disposition: A | Discharge status: Home / Self Care | Payer: Commercial Managed Care - PPO | Attending: Gastroenterology | Admitting: Gastroenterology

## 2020-09-30 ENCOUNTER — Encounter: Payer: Self-pay | Admitting: *Deleted

## 2020-09-30 SURGERY — COLONOSCOPY
Anesthesia: General

## 2020-09-30 MED ORDER — PROPOFOL 500 MG/50ML IV EMUL
INTRAVENOUS | Status: AC
  Filled 2020-09-30: qty 50

## 2020-09-30 MED ORDER — SODIUM CHLORIDE 0.9 % IV SOLN: INTRAVENOUS | Status: DC

## 2020-09-30 MED ORDER — FENTANYL CITRATE (PF) 100 MCG/2ML IJ SOLN
INTRAMUSCULAR | Status: AC
  Filled 2020-09-30: qty 2

## 2020-09-30 MED ORDER — PHENYLEPHRINE HCL (PRESSORS) 10 MG/ML IV SOLN
INTRAVENOUS | Status: AC
  Filled 2020-09-30: qty 1

## 2020-09-30 MED ORDER — LIDOCAINE HCL (PF) 2 % IJ SOLN
INTRAMUSCULAR | Status: AC
  Filled 2020-09-30: qty 5

## 2020-09-30 NOTE — Progress Notes (Signed)
Patient drank [place] on the way to the hospital. Informed patient of delay due to drinking and she got up and said to cancel it.

## 2021-04-25 ENCOUNTER — Other Ambulatory Visit: Payer: Self-pay | Admitting: Physician Assistant

## 2021-04-25 DIAGNOSIS — Z1231 Encounter for screening mammogram for malignant neoplasm of breast: Secondary | ICD-10-CM

## 2021-06-06 ENCOUNTER — Inpatient Hospital Stay: Admission: RE | Admit: 2021-06-06 | Payer: Commercial Managed Care - PPO | Source: Ambulatory Visit

## 2021-12-01 ENCOUNTER — Ambulatory Visit (INDEPENDENT_AMBULATORY_CARE_PROVIDER_SITE_OTHER): Payer: No Typology Code available for payment source | Admitting: Podiatry

## 2021-12-01 DIAGNOSIS — L600 Ingrowing nail: Secondary | ICD-10-CM | POA: Diagnosis not present

## 2021-12-06 ENCOUNTER — Encounter: Payer: Self-pay | Admitting: Podiatry

## 2021-12-06 NOTE — Progress Notes (Signed)
Subjective:  Patient ID: Natasha Contreras, female    DOB: 07-05-1960,  MRN: [id number]  Chief Complaint  Patient presents with   Ingrown Toenail    Bilateral ingrown     61 y.o. female presents with the above complaint.  Patient presents with complaint of right hallux medial border ingrown painful to touch is progressive gotten worse.  She states that hurts with ambulation worse with pressure pain scale is 5 out of 10 dull achy in nature.  Hurts with pressure from the shoe.  She would like to have it removed she has not seen anyone as prior to seeing me.   Review of Systems: Negative except as noted in the HPI. Denies N/V/F/Ch.  Past Medical History:  Diagnosis Date   Anemia    Arthritis    Diabetes mellitus, type 2 ([place])    in the past   GERD (gastroesophageal reflux disease)    Hypertension    Sleep apnea     Current Outpatient Medications:    amLODipine (NORVASC) 2.5 MG tablet, Take 2.5 mg by mouth at bedtime., Disp: , Rfl:    Cholecalciferol (VITAMIN D) 125 MCG (5000 UT) CAPS, Take 5,000 Units by mouth daily., Disp: , Rfl:    docusate sodium (COLACE) 100 MG capsule, Take 100 mg by mouth daily as needed for mild constipation., Disp: , Rfl:    doxycycline (VIBRAMYCIN) 100 MG capsule, Take 100 mg by mouth 2 (two) times daily., Disp: , Rfl:    ferrous sulfate 325 (65 FE) MG tablet, Take 325 mg by mouth daily with breakfast., Disp: , Rfl:    ibuprofen (ADVIL) 600 MG tablet, Take 1 tablet (600 mg total) by mouth every 8 (eight) hours as needed for mild pain or moderate pain., Disp: 60 tablet, Rfl: 1   Multiple Vitamin (MULTIVITAMIN WITH MINERALS) TABS tablet, Take 1 tablet by mouth daily., Disp: , Rfl:    mupirocin ointment (BACTROBAN) 2 %, 1 application 2 (two) times daily., Disp: , Rfl:    omeprazole (PRILOSEC) 20 MG capsule, Take 20 mg by mouth daily., Disp: , Rfl:    rosuvastatin (CRESTOR) 10 MG tablet, Take 10 mg by mouth daily., Disp: , Rfl:   Social History   Tobacco Use   Smoking Status Some Days   Types: Cigarettes  Smokeless Tobacco Never    Allergies  Allergen Reactions   Sulfa Antibiotics Swelling   Clindamycin Swelling    Tongue swelling   Penicillins Swelling   Objective:  There were no vitals filed for this visit. There is no height or weight on file to calculate BMI. Constitutional Well developed. Well nourished.  Vascular Dorsalis pedis pulses palpable bilaterally. Posterior tibial pulses palpable bilaterally. Capillary refill normal to all digits.  No cyanosis or clubbing noted. Pedal hair growth normal.  Neurologic Normal speech. Oriented to person, place, and time. Epicritic sensation to light touch grossly present bilaterally.  Dermatologic Painful ingrowing nail at medial nail borders of the hallux nail right. No other open wounds. No skin lesions.  Orthopedic: Normal joint ROM without pain or crepitus bilaterally. No visible deformities. No bony tenderness.   Radiographs: None Assessment:   1. Ingrown toenail of right foot    Plan:  Patient was evaluated and treated and all questions answered.  Ingrown Nail, right -Patient elects to proceed with minor surgery to remove ingrown toenail removal today. Consent reviewed and signed by patient. -Ingrown nail excised. See procedure note. -Educated on post-procedure care including soaking. Written instructions provided and  reviewed. -Patient to follow up in 2 weeks for nail check.  Procedure: Excision of Ingrown Toenail Location: Right 1st toe medial nail borders. Anesthesia: Lidocaine 1% plain; 1.5 mL and Marcaine 0.5% plain; 1.5 mL, digital block. Skin Prep: Betadine. Dressing: Silvadene; telfa; dry, sterile, compression dressing. Technique: Following skin prep, the toe was exsanguinated and a tourniquet was secured at the base of the toe. The affected nail border was freed, split with a nail splitter, and excised. Chemical matrixectomy was then performed with phenol and  irrigated out with alcohol. The tourniquet was then removed and sterile dressing applied. Disposition: Patient tolerated procedure well. Patient to return in 2 weeks for follow-up.   No follow-ups on file.

## 2022-01-20 ENCOUNTER — Ambulatory Visit (LOCAL_COMMUNITY_HEALTH_CENTER): Payer: No Typology Code available for payment source

## 2022-01-20 DIAGNOSIS — Z719 Counseling, unspecified: Secondary | ICD-10-CM

## 2022-01-20 DIAGNOSIS — Z23 Encounter for immunization: Secondary | ICD-10-CM

## 2022-01-20 NOTE — Progress Notes (Signed)
  Are you feeling sick today? No   Have you ever received a dose of COVID-19 Vaccine? [employer], [place], [place], [place], Other) Yes  If yes, which vaccine and how many doses?  [place], and  1 Moderna   Did you bring the vaccination record card or other documentation?  Yes   Do you have a health condition or are undergoing treatment that makes you moderately or severely immunocompromised? This would include, but not be limited to: cancer, HIV, organ transplant, immunosuppressive therapy/high-dose corticosteroids, or moderate/severe primary immunodeficiency.  No  Have you received COVID-19 vaccine before or during hematopoietic cell transplant (HCT) or CAR-T-cell therapies? No  Have you ever had an allergic reaction to: (This would include a severe allergic reaction or a reaction that caused hives, swelling, or respiratory distress, including wheezing.) A component of a COVID-19 vaccine or a previous dose of COVID-19 vaccine? No   Have you ever had an allergic reaction to another vaccine (other thanCOVID-19 vaccine) or an injectable medication? (This would include a severe allergic reaction or a reaction that caused hives, swelling, or respiratory distress, including wheezing.)   No    Do you have a history of any of the following:  Myocarditis or Pericarditis No  Dermal fillers:  No  Multisystem Inflammatory Syndrome (MIS-C or MIS-A)? No  COVID-19 disease within the past 3 months? No  Vaccinated with monkeypox vaccine in the last 4 weeks? No  Pt chose Pfizer covid vaccine. Pt's private insurance couldn't verify by clerk. Eligible for [employer] program, administered [employer] 12y+ [phone]. Monitored, tolerated well, provided VIS and NCIR copy. M.Jlon Betker, LPN.

## 2022-03-30 ENCOUNTER — Ambulatory Visit (INDEPENDENT_AMBULATORY_CARE_PROVIDER_SITE_OTHER): Payer: No Typology Code available for payment source | Admitting: Family Medicine

## 2022-03-30 ENCOUNTER — Encounter: Payer: Self-pay | Admitting: Family Medicine

## 2022-03-30 VITALS — BP 128/68 | HR 72 | Ht 64.5 in | Wt 253.0 lb

## 2022-03-30 DIAGNOSIS — E7800 Pure hypercholesterolemia, unspecified: Secondary | ICD-10-CM | POA: Diagnosis not present

## 2022-03-30 DIAGNOSIS — I10 Essential (primary) hypertension: Secondary | ICD-10-CM

## 2022-03-30 NOTE — Progress Notes (Signed)
Date:  03/30/2022   Name:  Natasha Contreras   DOB:  12-15-1960   MRN:  [id number]   Chief Complaint: Establish Care, Hypertension, and Hyperlipidemia  Hypertension This is a chronic problem. The problem has been gradually improving since onset. The problem is controlled. Pertinent negatives include no blurred vision, chest pain, headaches, palpitations or shortness of breath. Past treatments include ACE inhibitors. The current treatment provides mild improvement. There are no compliance problems.  There is no history of angina, kidney disease or CAD/MI. There is no history of chronic renal disease, a hypertension causing med or renovascular disease.  Hyperlipidemia This is a chronic problem. The current episode started more than 1 year ago. The problem is controlled. Recent lipid tests were reviewed and are normal. She has no history of chronic renal disease or diabetes. Factors aggravating her hyperlipidemia include thiazides. Pertinent negatives include no chest pain, focal sensory loss, leg pain, myalgias or shortness of breath. Current antihyperlipidemic treatment includes statins. The current treatment provides moderate improvement of lipids. There are no compliance problems.  Risk factors for coronary artery disease include dyslipidemia and hypertension.    No results found for: "NA", "K", "CO2", "GLUCOSE", "BUN", "CREATININE", "CALCIUM", "EGFR", "GFRNONAA" No results found for: "CHOL", "HDL", "LDLCALC", "LDLDIRECT", "TRIG", "CHOLHDL" No results found for: "TSH" No results found for: "HGBA1C" No results found for: "WBC", "HGB", "HCT", "MCV", "PLT" No results found for: "ALT", "AST", "GGT", "ALKPHOS", "BILITOT" No results found for: "25OHVITD2", "25OHVITD3", "VD25OH"   Review of Systems  Constitutional:  Negative for chills, diaphoresis and fever.  HENT:  Negative for trouble swallowing.   Eyes:  Negative for blurred vision.  Respiratory:  Negative for cough, chest tightness,  shortness of breath and wheezing.   Cardiovascular:  Negative for chest pain and palpitations.  Gastrointestinal:  Negative for abdominal pain, blood in stool, constipation and diarrhea.  Endocrine: Negative for polydipsia and polyuria.  Genitourinary:  Negative for difficulty urinating, menstrual problem and vaginal bleeding.  Musculoskeletal:  Negative for myalgias.  Neurological:  Negative for headaches.    Patient Active Problem List   Diagnosis Date Noted   Lipoma of lower back     Allergies  Allergen Reactions   Sulfa Antibiotics Swelling   Clindamycin Swelling    Tongue swelling   Penicillins Swelling    Past Surgical History:  Procedure Laterality Date   ABDOMINAL HYSTERECTOMY     APPENDECTOMY     BREAST BIOPSY Right 05/2017   neg   EXCISION OF BACK LESION N/A 09/23/2019   Procedure: EXCISION OF BACK LESION;  Surgeon: Olean Ree, MD;  Location: [facility] ORS;  Service: General;  Laterality: N/A;   GASTRIC BYPASS  2014   LAPAROSCOPIC CHOLECYSTECTOMY      Social History   Tobacco Use   Smoking status: Some Days    Types: Cigarettes   Smokeless tobacco: Never   Tobacco comments:    3 cigs/ day  Vaping Use   Vaping Use: Never used  Substance Use Topics   Alcohol use: Yes    Comment: rare   Drug use: Never     Medication list has been reviewed and updated.  Current Meds  Medication Sig   amLODipine (NORVASC) 2.5 MG tablet Take 2.5 mg by mouth at bedtime.   Cholecalciferol (VITAMIN D) 125 MCG (5000 UT) CAPS Take 5,000 Units by mouth daily.   docusate sodium (COLACE) 100 MG capsule Take 100 mg by mouth daily as needed for mild constipation.  gabapentin (NEURONTIN) 100 MG capsule Take 200 mg by mouth at bedtime.   ibuprofen (ADVIL) 600 MG tablet Take 1 tablet (600 mg total) by mouth every 8 (eight) hours as needed for mild pain or moderate pain.   Multiple Vitamin (MULTIVITAMIN WITH MINERALS) TABS tablet Take 1 tablet by mouth daily.   mupirocin ointment  (BACTROBAN) 2 % 1 application 2 (two) times daily.   omeprazole (PRILOSEC) 20 MG capsule Take 20 mg by mouth daily.   rosuvastatin (CRESTOR) 10 MG tablet Take 10 mg by mouth daily.   [DISCONTINUED] doxycycline (VIBRAMYCIN) 100 MG capsule Take 100 mg by mouth 2 (two) times daily.       03/30/2022    4:00 PM  GAD 7 : Generalized Anxiety Score  Nervous, Anxious, on Edge 0  Control/stop worrying 0  Worry too much - different things 0  Trouble relaxing 0  Restless 0  Easily annoyed or irritable 0  Afraid - awful might happen 0  Total GAD 7 Score 0  Anxiety Difficulty Not difficult at all       03/30/2022    4:00 PM  Depression screen PHQ 2/9  Decreased Interest 0  Down, Depressed, Hopeless 0  PHQ - 2 Score 0  Altered sleeping 0  Tired, decreased energy 0  Change in appetite 0  Feeling bad or failure about yourself  0  Trouble concentrating 0  Moving slowly or fidgety/restless 0  Suicidal thoughts 0  PHQ-9 Score 0  Difficult doing work/chores Not difficult at all    BP Readings from Last 3 Encounters:  03/30/22 128/68  02/20/20 (!) 142/95  12/31/19 134/78    Physical Exam Vitals and nursing note reviewed. Exam conducted with a chaperone present.  Constitutional:      General: She is not in acute distress.    Appearance: She is not diaphoretic.  HENT:     Head: Normocephalic and atraumatic.     Right Ear: Hearing, tympanic membrane, ear canal and external ear normal.     Left Ear: Hearing, tympanic membrane, ear canal and external ear normal.     Nose: Nose normal.     Mouth/Throat:     Mouth: Mucous membranes are moist.     Tongue: No lesions.     Pharynx: Oropharynx is clear. Uvula midline.  Eyes:     General:        Right eye: No discharge.        Left eye: No discharge.     Conjunctiva/sclera: Conjunctivae normal.     Pupils: Pupils are equal, round, and reactive to light.  Neck:     Thyroid: No thyromegaly.     Vascular: No JVD.  Cardiovascular:      Rate and Rhythm: Normal rate and regular rhythm.     Pulses:          Dorsalis pedis pulses are 2+ on the right side and 2+ on the left side.     Heart sounds: Normal heart sounds, S1 normal and S2 normal. No murmur heard.    No systolic murmur is present.     No diastolic murmur is present.     No friction rub. No gallop. No S3 or S4 sounds.  Pulmonary:     Effort: Pulmonary effort is normal.     Breath sounds: Normal breath sounds.  Abdominal:     General: Bowel sounds are normal.     Palpations: Abdomen is soft. There is no hepatomegaly, splenomegaly or mass.  Tenderness: There is no abdominal tenderness. There is no guarding.  Musculoskeletal:        General: Normal range of motion.     Cervical back: Normal range of motion and neck supple.  Lymphadenopathy:     Cervical: No cervical adenopathy.  Skin:    General: Skin is warm and dry.  Neurological:     Mental Status: She is alert.     Wt Readings from Last 3 Encounters:  03/30/22 253 lb (114.8 kg)  02/20/20 265 lb 12.8 oz (120.6 kg)  12/31/19 266 lb (120.7 kg)    BP 128/68   Pulse 72   Ht 5' 4.5" (1.638 m)   Wt 253 lb (114.8 kg)   SpO2 96%   BMI 42.76 kg/m   Assessment and Plan:  Patient is a 62year old female who presents to establish care as new patient. The patient reports the following problems: None. Health maintenance has been reviewed to be discussed 1. Primary hypertension Chronic.  Controlled.  Stable.  Blood pressure 128/68.  Asymptomatic.  Tolerating medication well will continue on current dosing of amlodipine 2.5 mg daily.  2. Hypercholesterolemia Chronic.  Controlled.  Stable.  Asymptomatic on current dosing of rosuvastatin 10 mg once a day.  Otilio Miu, MD

## 2022-03-31 ENCOUNTER — Encounter: Payer: Self-pay | Admitting: Podiatry

## 2022-03-31 ENCOUNTER — Ambulatory Visit (INDEPENDENT_AMBULATORY_CARE_PROVIDER_SITE_OTHER): Payer: No Typology Code available for payment source

## 2022-03-31 ENCOUNTER — Ambulatory Visit (INDEPENDENT_AMBULATORY_CARE_PROVIDER_SITE_OTHER): Payer: No Typology Code available for payment source | Admitting: Podiatry

## 2022-03-31 VITALS — BP 143/85 | HR 72

## 2022-03-31 DIAGNOSIS — L84 Corns and callosities: Secondary | ICD-10-CM

## 2022-03-31 NOTE — Progress Notes (Unsigned)
Subjective:  Patient ID: Natasha Contreras, female    DOB: 06/11/1960,  MRN: [id number]  Chief Complaint  Patient presents with   Callouses    "I have something in between my toes." N - corn L - 4th digit right lateral D - 2 mos O - suddenly, got worse C - swelling, shooting pain A - walking, standing, pressure shoes T - soaking my feet, corn pads     62 y.o. female presents with the above complaint.  Patient presents with right fourth and fifth digit heloma molle.  Patient states is painful to touch is progressive gotten worse he just came out of nowhere.  She has tried some shoe gear modification which helped some.  She wanted to get it evaluated she has not seen anyone else prior to seeing me pain scale 7 out of 10 dull achy in nature.   Review of Systems: Negative except as noted in the HPI. Denies N/V/F/Ch.  Past Medical History:  Diagnosis Date   Anemia    Arthritis    Diabetes mellitus, type 2 ([facility])    in the past   GERD (gastroesophageal reflux disease)    Hyperlipidemia    Hypertension    Sleep apnea     Current Outpatient Medications:    amLODipine (NORVASC) 2.5 MG tablet, Take 2.5 mg by mouth at bedtime., Disp: , Rfl:    Cholecalciferol (VITAMIN D) 125 MCG (5000 UT) CAPS, Take 5,000 Units by mouth daily., Disp: , Rfl:    docusate sodium (COLACE) 100 MG capsule, Take 100 mg by mouth daily as needed for mild constipation., Disp: , Rfl:    gabapentin (NEURONTIN) 100 MG capsule, Take 200 mg by mouth at bedtime., Disp: , Rfl:    ibuprofen (ADVIL) 600 MG tablet, Take 1 tablet (600 mg total) by mouth every 8 (eight) hours as needed for mild pain or moderate pain., Disp: 60 tablet, Rfl: 1   Multiple Vitamin (MULTIVITAMIN WITH MINERALS) TABS tablet, Take 1 tablet by mouth daily., Disp: , Rfl:    mupirocin ointment (BACTROBAN) 2 %, 1 application 2 (two) times daily., Disp: , Rfl:    omeprazole (PRILOSEC) 20 MG capsule, Take 20 mg by mouth daily., Disp: , Rfl:     rosuvastatin (CRESTOR) 10 MG tablet, Take 10 mg by mouth daily., Disp: , Rfl:   Social History   Tobacco Use  Smoking Status Some Days   Packs/day: .2   Types: Cigarettes  Smokeless Tobacco Never  Tobacco Comments   3 cigs/ day    Allergies  Allergen Reactions   Sulfa Antibiotics Swelling   Clindamycin Swelling    Tongue swelling   Penicillins Swelling   Objective:   Vitals:   03/31/22 1359  BP: (!) 143/85  Pulse: 72   There is no height or weight on file to calculate BMI. Constitutional Well developed. Well nourished.  Vascular Dorsalis pedis pulses palpable bilaterally. Posterior tibial pulses palpable bilaterally. Capillary refill normal to all digits.  No cyanosis or clubbing noted. Pedal hair growth normal.  Neurologic Normal speech. Oriented to person, place, and time. Epicritic sensation to light touch grossly present bilaterally.  Dermatologic Right fourth and fifth digit heloma molle with hyperkeratotic lesion with underlying hammertoe contracture noted.  No central nucleated core noted no pinpoint bleeding noted upon debridement  Orthopedic: Normal joint ROM without pain or crepitus bilaterally. No visible deformities. No bony tenderness.   Radiographs: None Assessment:   1. Corns and callosities   2. Heloma molle  Plan:  Patient was evaluated and treated and all questions answered.  Heloma molle right fourth interdigital space -All questions and concerns were discussed with the patient in extensive detail given the amount of pain that she is experiencing she will benefit from debridement of the lesion using chisel blade to handle the lesion was debrided down as a courtesy to healthy tissue.  She had some immediate relief. Also discussed shoe gear modification and toe protectors as well.  If there is no improvement patient will need surgical intervention  No follow-ups on file.  Right fourth heloma molle shoe changes toe protectors

## 2022-04-04 ENCOUNTER — Ambulatory Visit: Payer: No Typology Code available for payment source | Admitting: Podiatry

## 2022-06-23 ENCOUNTER — Ambulatory Visit
Admission: EM | Admit: 2022-06-23 | Discharge: 2022-06-23 | Disposition: A | Discharge status: Home / Self Care | Payer: No Typology Code available for payment source | Attending: Physician Assistant | Admitting: Physician Assistant

## 2022-06-23 ENCOUNTER — Encounter: Payer: Self-pay | Admitting: Emergency Medicine

## 2022-06-23 DIAGNOSIS — W5311XA Bitten by rat, initial encounter: Secondary | ICD-10-CM | POA: Diagnosis not present

## 2022-06-23 DIAGNOSIS — Z23 Encounter for immunization: Secondary | ICD-10-CM

## 2022-06-23 DIAGNOSIS — S61402A Unspecified open wound of left hand, initial encounter: Secondary | ICD-10-CM

## 2022-06-23 MED ORDER — DOXYCYCLINE HYCLATE 100 MG PO CAPS: 100.0000 mg | ORAL_CAPSULE | Freq: Two times a day (BID) | ORAL | 0 refills | Status: AC

## 2022-06-23 MED ORDER — TETANUS-DIPHTH-ACELL PERTUSSIS 5-2.5-18.5 LF-MCG/0.5 IM SUSY
0.5000 mL | PREFILLED_SYRINGE | Freq: Once | INTRAMUSCULAR | Status: AC
  Administered 2022-06-23: 0.5 mL via INTRAMUSCULAR

## 2022-06-23 NOTE — Discharge Instructions (Addendum)
-  Tetanus immunization was updated today. - Continue to clean your wounds regularly and apply triple antibiotic ointment. - I sent antibiotics to the pharmacy to try to prevent infection.  Start them right away. - If you notice increased redness, swelling or pustular drainage, please return for reevaluation.  If you develop a fever, please return for reevaluation. - You can take Tylenol and ice your fingers for discomfort.

## 2022-06-23 NOTE — ED Provider Notes (Signed)
[facility]    CSN: [id number] Arrival date & time: 06/23/22  1706      History   Chief Complaint Chief Complaint  Patient presents with   Animal Bite    Rat    HPI Natasha Contreras is a 62 y.o. female presenting for multiple puncture wounds of the left thumb, index finger and right thumb following an encounter with a rat that occurred 1 hour ago.  Patient reports her cat sometimes bring in rats and mice.  Reports she noticed a rat in her room shower and tried to grab it by the tail but it latched onto her hand and would not let go.  She says she cleaned the wounds with soap, water and hydrogen peroxide.  She presents here today for tetanus immunization and to see if she needs further treatment.  HPI  Past Medical History:  Diagnosis Date   Anemia    Arthritis    Diabetes mellitus, type 2 ([facility])    in the past   GERD (gastroesophageal reflux disease)    Hyperlipidemia    Hypertension    Sleep apnea     Patient Active Problem List   Diagnosis Date Noted   Lipoma of lower back     Past Surgical History:  Procedure Laterality Date   ABDOMINAL HYSTERECTOMY     APPENDECTOMY     BREAST BIOPSY Right 05/2017   neg   EXCISION OF BACK LESION N/A 09/23/2019   Procedure: EXCISION OF BACK LESION;  Surgeon: Henrene Dodge, MD;  Location: [facility] ORS;  Service: General;  Laterality: N/A;   GASTRIC BYPASS  2014   LAPAROSCOPIC CHOLECYSTECTOMY      OB History   No obstetric history on file.      Home Medications    Prior to Admission medications   Medication Sig Start Date End Date Taking? Authorizing Provider  doxycycline (VIBRAMYCIN) 100 MG capsule Take 1 capsule (100 mg total) by mouth 2 (two) times daily for 3 days. 06/23/22 06/26/22 Yes Eusebio Friendly B, PA-C  amLODipine (NORVASC) 2.5 MG tablet Take 2.5 mg by mouth at bedtime.    [provider]  Cholecalciferol (VITAMIN D) 125 MCG (5000 UT) CAPS Take 5,000 Units by mouth daily.    [provider]   docusate sodium (COLACE) 100 MG capsule Take 100 mg by mouth daily as needed for mild constipation.    [provider]  gabapentin (NEURONTIN) 100 MG capsule Take 200 mg by mouth at bedtime.    [provider]  ibuprofen (ADVIL) 600 MG tablet Take 1 tablet (600 mg total) by mouth every 8 (eight) hours as needed for mild pain or moderate pain. 09/23/19   Henrene Dodge, MD  Multiple Vitamin (MULTIVITAMIN WITH MINERALS) TABS tablet Take 1 tablet by mouth daily.    [provider]  mupirocin ointment (BACTROBAN) 2 % 1 application 2 (two) times daily.    [provider]  omeprazole (PRILOSEC) 20 MG capsule Take 20 mg by mouth daily.    [provider]  rosuvastatin (CRESTOR) 10 MG tablet Take 10 mg by mouth daily.    [provider]    Family History Family History  Problem Relation Age of Onset   Hypertension Mother    Hyperlipidemia Father    Breast cancer Maternal Grandmother 35    Social History Social History   Tobacco Use   Smoking status: Some Days    Packs/day: .2    Types: Cigarettes  Smokeless tobacco: Never   Tobacco comments:    3 cigs/ day  Vaping Use   Vaping Use: Never used  Substance Use Topics   Alcohol use: Not Currently    Comment: rare   Drug use: Never     Allergies   Sulfa antibiotics, Clindamycin, and Penicillins   Review of Systems Review of Systems  Musculoskeletal:  Negative for arthralgias and joint swelling.  Skin:  Positive for wound. Negative for color change.  Neurological:  Negative for weakness and numbness.     Physical Exam Triage Vital Signs ED Triage Vitals  Enc Vitals Group     BP 06/23/22 1721 (!) 145/85     Pulse Rate 06/23/22 1721 72     Resp 06/23/22 1721 15     Temp 06/23/22 1721 98 F (36.7 C)     Temp Source 06/23/22 1721 Oral     SpO2 06/23/22 1721 95 %     Weight 06/23/22 1720 253 lb 1.4 oz (114.8 kg)     Height 06/23/22 1720 5' 4.5" (1.638 m)     Head  Circumference --      Peak Flow --      Pain Score 06/23/22 1720 4     Pain Loc --      Pain Edu? --      Excl. in GC? --    No data found.  Updated Vital Signs BP (!) 145/85 (BP Location: Right Arm)   Pulse 72   Temp 98 F (36.7 C) (Oral)   Resp 15   Ht 5' 4.5" (1.638 m)   Wt 253 lb 1.4 oz (114.8 kg)   SpO2 95%   BMI 42.77 kg/m       Physical Exam Vitals and nursing note reviewed.  Constitutional:      General: She is not in acute distress.    Appearance: Normal appearance. She is not ill-appearing or toxic-appearing.  HENT:     Head: Normocephalic and atraumatic.  Eyes:     General: No scleral icterus.       Right eye: No discharge.        Left eye: No discharge.     Conjunctiva/sclera: Conjunctivae normal.  Cardiovascular:     Rate and Rhythm: Normal rate.     Pulses: Normal pulses.  Pulmonary:     Effort: Pulmonary effort is normal. No respiratory distress.  Musculoskeletal:     Cervical back: Neck supple.  Skin:    General: Skin is dry.     Comments: There are multiple tiny superficial lacerations/punctures of the left thumb and index finger and right thumb.  Neurological:     General: No focal deficit present.     Mental Status: She is alert. Mental status is at baseline.     Motor: No weakness.     Gait: Gait normal.  Psychiatric:        Mood and Affect: Mood normal.        Behavior: Behavior normal.        Thought Content: Thought content normal.      UC Treatments / Results  Labs (all labs ordered are listed, but only abnormal results are displayed) Labs Reviewed - No data to display  EKG   Radiology No results found.  Procedures Procedures (including critical care time)  Medications Ordered in UC Medications  Tdap (BOOSTRIX) injection 0.5 mL (0.5 mLs Intramuscular Given 06/23/22 1725)    Initial Impression / Assessment and Plan / UC Course  I have reviewed the triage vital signs and the nursing notes.  Pertinent labs & imaging  results that were available during my care of the patient were reviewed by me and considered in my medical decision making (see chart for details).   62 year old female presents for multiple lacerations and abrasions of the left thumb/index finger and right thumb after picking up a rat that bit her.  Unsure as to last tetanus immunization so that has been updated today.  She has cleaned the area with soap, water and hydrogen peroxide.  Prophylactic treatment with doxycycline to be initiated today.  Discussed wound care.  Reviewed return guidelines.  Reviewed Tylenol for pain relief.  Follow-up as needed.   Final Clinical Impressions(s) / UC Diagnoses   Final diagnoses:  Open wounds of multiple sites of hand, left, initial encounter  Rat bite, initial encounter     Discharge Instructions      -Tetanus immunization was updated today. - Continue to clean your wounds regularly and apply triple antibiotic ointment. - I sent antibiotics to the pharmacy to try to prevent infection.  Start them right away. - If you notice increased redness, swelling or pustular drainage, please return for reevaluation.  If you develop a fever, please return for reevaluation. - You can take Tylenol and ice your fingers for discomfort.     ED Prescriptions     Medication Sig Dispense Auth. Provider   doxycycline (VIBRAMYCIN) 100 MG capsule Take 1 capsule (100 mg total) by mouth 2 (two) times daily for 3 days. 6 capsule Shirlee Latch, PA-C      PDMP not reviewed this encounter.   Shirlee Latch, PA-C 06/23/22 1755

## 2022-06-23 NOTE — ED Triage Notes (Signed)
Patient states that today she found a rat in her shower and when she went to pick it up and remove it the rat bit her left 2nd finger and scratched her right thumb.  Patient states that she needs a tetanus shot.  Patient reports pain in her left 2nd finger.

## 2022-08-14 ENCOUNTER — Ambulatory Visit: Payer: No Typology Code available for payment source | Admitting: Family Medicine

## 2022-08-25 ENCOUNTER — Ambulatory Visit (INDEPENDENT_AMBULATORY_CARE_PROVIDER_SITE_OTHER): Payer: No Typology Code available for payment source | Admitting: Family Medicine

## 2022-08-25 ENCOUNTER — Encounter: Payer: Self-pay | Admitting: Family Medicine

## 2022-08-25 VITALS — BP 124/74 | HR 73 | Ht 64.5 in | Wt 251.0 lb

## 2022-08-25 DIAGNOSIS — I10 Essential (primary) hypertension: Secondary | ICD-10-CM | POA: Diagnosis not present

## 2022-08-25 DIAGNOSIS — H9313 Tinnitus, bilateral: Secondary | ICD-10-CM

## 2022-08-25 DIAGNOSIS — E7800 Pure hypercholesterolemia, unspecified: Secondary | ICD-10-CM

## 2022-08-25 DIAGNOSIS — G609 Hereditary and idiopathic neuropathy, unspecified: Secondary | ICD-10-CM

## 2022-08-25 DIAGNOSIS — R7303 Prediabetes: Secondary | ICD-10-CM

## 2022-08-25 MED ORDER — AMLODIPINE BESYLATE 2.5 MG PO TABS: 2.5000 mg | ORAL_TABLET | Freq: Every evening | ORAL | 1 refills | Status: DC

## 2022-08-25 MED ORDER — ROSUVASTATIN CALCIUM 10 MG PO TABS: 10.0000 mg | ORAL_TABLET | Freq: Every day | ORAL | 1 refills | Status: DC

## 2022-08-25 NOTE — Progress Notes (Signed)
Date:  08/25/2022   Name:  Natasha Contreras   DOB:  10-09-1960   MRN:  [id number]   Chief Complaint: Hyperlipidemia, Hypertension, Tinnitus, and Peripheral Neuropathy (Bilateral legs)  Hyperlipidemia This is a chronic problem. The current episode started more than 1 year ago. The problem is controlled. Recent lipid tests were reviewed and are normal. She has no history of chronic renal disease, diabetes or hypothyroidism. Associated symptoms include myalgias. Pertinent negatives include no chest pain, leg pain or shortness of breath. Current antihyperlipidemic treatment includes statins. The current treatment provides moderate improvement of lipids. There are no compliance problems.  Risk factors: prediabetes.  Hypertension This is a chronic problem. The current episode started more than 1 year ago. The problem has been gradually improving since onset. Associated symptoms include peripheral edema. Pertinent negatives include no anxiety, blurred vision, chest pain, headaches, orthopnea, palpitations, PND or shortness of breath. There are no associated agents to hypertension. Risk factors for coronary artery disease include dyslipidemia. Past treatments include calcium channel blockers. The current treatment provides moderate improvement. There is no history of CAD/MI or CVA. There is no history of chronic renal disease, a hypertension causing med or renovascular disease.  Diabetes She presents for her follow-up diabetic visit. Diabetes type: prediabetes. Pertinent negatives for hypoglycemia include no dizziness or headaches. Pertinent negatives for diabetes include no blurred vision, no chest pain, no polydipsia and no polyuria. There are no hypoglycemic complications. Diabetic complications include peripheral neuropathy. Pertinent negatives for diabetic complications include no CVA.    No results found for: "NA", "K", "CO2", "GLUCOSE", "BUN", "CREATININE", "CALCIUM", "EGFR", "GFRNONAA" No results  found for: "CHOL", "HDL", "LDLCALC", "LDLDIRECT", "TRIG", "CHOLHDL" No results found for: "TSH" No results found for: "HGBA1C" No results found for: "WBC", "HGB", "HCT", "MCV", "PLT" No results found for: "ALT", "AST", "GGT", "ALKPHOS", "BILITOT" No results found for: "25OHVITD2", "25OHVITD3", "VD25OH"   Review of Systems  Constitutional:  Negative for chills, diaphoresis and fever.  Eyes:  Negative for blurred vision.  Respiratory:  Negative for cough, chest tightness, shortness of breath, wheezing and stridor.   Cardiovascular:  Positive for leg swelling. Negative for chest pain, palpitations, orthopnea and PND.  Gastrointestinal:  Negative for abdominal pain, constipation and diarrhea.  Endocrine: Negative for polydipsia and polyuria.  Genitourinary:  Negative for difficulty urinating and vaginal bleeding.  Musculoskeletal:  Positive for myalgias.  Neurological:  Negative for dizziness and headaches.    Patient Active Problem List   Diagnosis Date Noted   Lipoma of lower back     Allergies  Allergen Reactions   Sulfa Antibiotics Swelling   Clindamycin Swelling    Tongue swelling   Penicillins Swelling    Past Surgical History:  Procedure Laterality Date   ABDOMINAL HYSTERECTOMY     APPENDECTOMY     BREAST BIOPSY Right 05/2017   neg   EXCISION OF BACK LESION N/A 09/23/2019   Procedure: EXCISION OF BACK LESION;  Surgeon: Henrene Dodge, MD;  Location: [facility] ORS;  Service: General;  Laterality: N/A;   GASTRIC BYPASS  2014   LAPAROSCOPIC CHOLECYSTECTOMY      Social History   Tobacco Use   Smoking status: Some Days    Current packs/day: 0.20    Types: Cigarettes   Smokeless tobacco: Never   Tobacco comments:    3 cigs/ day  Vaping Use   Vaping status: Never Used  Substance Use Topics   Alcohol use: Not Currently    Comment: rare   Drug  use: Never     Medication list has been reviewed and updated.  Current Meds  Medication Sig   amLODipine (NORVASC) 2.5 MG  tablet Take 2.5 mg by mouth at bedtime.   Cholecalciferol (VITAMIN D) 125 MCG (5000 UT) CAPS Take 5,000 Units by mouth daily.   docusate sodium (COLACE) 100 MG capsule Take 100 mg by mouth daily as needed for mild constipation.   gabapentin (NEURONTIN) 100 MG capsule Take 200 mg by mouth at bedtime.   ibuprofen (ADVIL) 600 MG tablet Take 1 tablet (600 mg total) by mouth every 8 (eight) hours as needed for mild pain or moderate pain.   Multiple Vitamin (MULTIVITAMIN WITH MINERALS) TABS tablet Take 1 tablet by mouth daily.   mupirocin ointment (BACTROBAN) 2 % 1 application 2 (two) times daily.   omeprazole (PRILOSEC) 20 MG capsule Take 20 mg by mouth daily.   rosuvastatin (CRESTOR) 10 MG tablet Take 10 mg by mouth daily.       08/25/2022    2:51 PM 03/30/2022    4:00 PM  GAD 7 : Generalized Anxiety Score  Nervous, Anxious, on Edge 0 0  Control/stop worrying 0 0  Worry too much - different things 0 0  Trouble relaxing 0 0  Restless 0 0  Easily annoyed or irritable 2 0  Afraid - awful might happen 0 0  Total GAD 7 Score 2 0  Anxiety Difficulty Not difficult at all Not difficult at all       08/25/2022    2:51 PM 03/30/2022    4:00 PM  Depression screen PHQ 2/9  Decreased Interest 2 0  Down, Depressed, Hopeless 2 0  PHQ - 2 Score 4 0  Altered sleeping 2 0  Tired, decreased energy 2 0  Change in appetite 0 0  Feeling bad or failure about yourself  0 0  Trouble concentrating 0 0  Moving slowly or fidgety/restless 0 0  Suicidal thoughts 0 0  PHQ-9 Score 8 0  Difficult doing work/chores Not difficult at all Not difficult at all    BP Readings from Last 3 Encounters:  08/25/22 124/74  06/23/22 (!) 145/85  03/31/22 (!) 143/85    Physical Exam Vitals and nursing note reviewed.  HENT:     Head: Normocephalic.     Right Ear: Tympanic membrane, ear canal and external ear normal. There is no impacted cerumen.     Left Ear: Tympanic membrane and external ear normal. There is no  impacted cerumen.     Nose: Nose normal. No congestion or rhinorrhea.     Mouth/Throat:     Mouth: Mucous membranes are moist.     Pharynx: No oropharyngeal exudate or posterior oropharyngeal erythema.  Eyes:     Pupils: Pupils are equal, round, and reactive to light.  Cardiovascular:     Rate and Rhythm: Normal rate and regular rhythm.     Pulses:          Dorsalis pedis pulses are 2+ on the right side and 2+ on the left side.       Posterior tibial pulses are 1+ on the right side and 1+ on the left side.     Heart sounds: S1 normal and S2 normal. No murmur heard.    No systolic murmur is present.     No diastolic murmur is present.     No friction rub. No gallop.     Comments: Noted varicosities/minimal edema continue amlodipine 2.5 mg nightly. Pulmonary:  Breath sounds: No wheezing, rhonchi or rales.  Chest:     Chest wall: No tenderness.  Abdominal:     Tenderness: There is no guarding or rebound.     Hernia: No hernia is present.  Musculoskeletal:     Cervical back: Neck supple.  Neurological:     Mental Status: She is alert.     Wt Readings from Last 3 Encounters:  08/25/22 251 lb (113.9 kg)  06/23/22 253 lb 1.4 oz (114.8 kg)  03/30/22 253 lb (114.8 kg)    BP 124/74   Pulse 73   Ht 5' 4.5" (1.638 m)   Wt 251 lb (113.9 kg)   SpO2 97%   BMI 42.42 kg/m   Assessment and Plan: 1. Primary hypertension Chronic.  Controlled.  Stable.  Blood pressure 124/74.  Asymptomatic.  Tolerating medication well.  Will check CMP for electrolytes and GFR and will recheck in 6 months. - amLODipine (NORVASC) 2.5 MG tablet; Take 1 tablet (2.5 mg total) by mouth at bedtime.  Dispense: 90 tablet; Refill: 1 - Comprehensive Metabolic Panel (CMET)  2. Hypercholesterolemia Chronic.  Controlled.  Stable.  Asymptomatic.  Tolerating current dosing of rosuvastatin 10 mg once a day.  Will check LDL via lipid panel. - rosuvastatin (CRESTOR) 10 MG tablet; Take 1 tablet (10 mg total) by mouth  daily.  Dispense: 90 tablet; Refill: 1 - Lipid Panel With LDL/HDL Ratio  3. Prediabetes Chronic.  Controlled.  Stable.  A1c last check April 2023 was 5.9.  Will recheck A1c as well as glucose and assess GFR.  Patient was unable to tolerate metformin due to diarrhea but would be considered a possibility for metformin XR if diabetic status reached. - HgB A1c - Comprehensive Metabolic Panel (CMET)  4. Idiopathic peripheral neuropathy Patient has burning in both feet which is consistent with neuropathy but is only being treated rather than evaluated.  I will send to neurology to take a look and to run through evaluation if there is an etiology to it and if indeed it is idiopathic - Ambulatory referral to Neurology  5. Tinnitus of both ears Relatively new onset.  Bilateral.  As noted during times that there is no competing noise and perhaps is improved when she clears her ears.  Will refer to ear nose throat for both decreased hearing and persistence of tinnitus bilateral. - Ambulatory referral to ENT     Elizabeth Sauer, MD

## 2022-08-30 ENCOUNTER — Encounter: Payer: Self-pay | Admitting: Family Medicine

## 2022-08-31 ENCOUNTER — Ambulatory Visit
Admission: EM | Admit: 2022-08-31 | Discharge: 2022-08-31 | Disposition: A | Discharge status: Home / Self Care | Payer: No Typology Code available for payment source | Attending: Family Medicine | Admitting: Family Medicine

## 2022-08-31 ENCOUNTER — Other Ambulatory Visit: Payer: Self-pay

## 2022-08-31 DIAGNOSIS — N3001 Acute cystitis with hematuria: Secondary | ICD-10-CM | POA: Insufficient documentation

## 2022-08-31 LAB — URINALYSIS, W/ REFLEX TO CULTURE (INFECTION SUSPECTED)
Bilirubin Urine: NEGATIVE
Glucose, UA: NEGATIVE mg/dL
Ketones, ur: NEGATIVE mg/dL
Nitrite: NEGATIVE
Protein, ur: 30 mg/dL — AB
Specific Gravity, Urine: >1.03 — ABNORMAL HIGH (ref 1.005–1.030)
pH: 6 (ref 5.0–8.0)

## 2022-08-31 MED ORDER — NITROFURANTOIN MONOHYD MACRO 100 MG PO CAPS: 100.0000 mg | ORAL_CAPSULE | Freq: Two times a day (BID) | ORAL | 0 refills | Status: AC

## 2022-08-31 NOTE — ED Triage Notes (Signed)
Pt c/o urinary frequency and urinary retention symptoms started on Saturday. Today pt states she is having pain upon urination. Sent by PMD with concern for UTI

## 2022-08-31 NOTE — Discharge Instructions (Addendum)
Stop by the pharmacy to pick up your prescriptions.  Follow up with your primary care provider as needed.  

## 2022-08-31 NOTE — ED Provider Notes (Signed)
[facility]    CSN: [id number] Arrival date & time: 08/31/22  1826      History   Chief Complaint Chief Complaint  Patient presents with   Urinary Retention   Urinary Frequency     HPI HPI Natasha Contreras is a 62 y.o. female.    Natasha Contreras presents for decreased urination after not drinking much after her fathers funeral this past weekend.  Has urinary incontinence,  urinary frequency and dysuria .  Feels bloated but no abdominal pain.  She has been wearing a pad to protect her clothes.  She is postmenopausal.  - Abnormal vaginal discharge: no - vaginal odor: no - vaginal bleeding: no - Dysuria: yes - Hematuria: no - Urinary urgency: Yes - Urinary frequency: Yes - Fever: no - Abdominal pain: Yes - Pelvic pain: no - Rash/Skin lesions/mouth ulcers: no - Nausea: no  - Vomiting: no  - Back Pain: Not new       Past Medical History:  Diagnosis Date   Anemia    Arthritis    Diabetes mellitus, type 2 (HCC)    in the past   GERD (gastroesophageal reflux disease)    Hyperlipidemia    Hypertension    Sleep apnea     Patient Active Problem List   Diagnosis Date Noted   Lipoma of lower back     Past Surgical History:  Procedure Laterality Date   ABDOMINAL HYSTERECTOMY     APPENDECTOMY     BREAST BIOPSY Right 05/2017   neg   EXCISION OF BACK LESION N/A 09/23/2019   Procedure: EXCISION OF BACK LESION;  Surgeon: Henrene Dodge, MD;  Location: [facility] ORS;  Service: General;  Laterality: N/A;   GASTRIC BYPASS  2014   LAPAROSCOPIC CHOLECYSTECTOMY      OB History   No obstetric history on file.      Home Medications    Prior to Admission medications   Medication Sig Start Date End Date Taking? Authorizing Provider  amLODipine (NORVASC) 2.5 MG tablet Take 1 tablet (2.5 mg total) by mouth at bedtime. 08/25/22  Yes Duanne Limerick, MD  Cholecalciferol (VITAMIN D) 125 MCG (5000 UT) CAPS Take 5,000 Units by mouth daily.   Yes [provider]  docusate sodium (COLACE) 100 MG capsule Take 100 mg by mouth daily as needed for mild constipation.   Yes [provider]  gabapentin (NEURONTIN) 100 MG capsule Take 200 mg by mouth at bedtime.   Yes [provider]  ibuprofen (ADVIL) 600 MG tablet Take 1 tablet (600 mg total) by mouth every 8 (eight) hours as needed for mild pain or moderate pain. 09/23/19  Yes Piscoya, Elita Quick, MD  Multiple Vitamin (MULTIVITAMIN WITH MINERALS) TABS tablet Take 1 tablet by mouth daily.   Yes [provider]  mupirocin ointment (BACTROBAN) 2 % 1 application 2 (two) times daily.   Yes [provider]  nitrofurantoin, macrocrystal-monohydrate, (MACROBID) 100 MG capsule Take 1 capsule (100 mg total) by mouth 2 (two) times daily. 08/31/22  Yes Bashar Milam, DO  omeprazole (PRILOSEC) 20 MG capsule Take 20 mg by mouth daily.   Yes [provider]  rosuvastatin (CRESTOR) 10 MG tablet Take 1 tablet (10 mg total) by mouth daily. 08/25/22  Yes Duanne Limerick, MD    Family History Family History  Problem Relation Age of Onset   Hypertension Mother    Hyperlipidemia Father    Breast cancer Maternal Grandmother 64  Social History Social History   Tobacco Use   Smoking status: Some Days    Current packs/day: 0.20    Types: Cigarettes   Smokeless tobacco: Never   Tobacco comments:    3 cigs/ day  Vaping Use   Vaping status: Never Used  Substance Use Topics   Alcohol use: Not Currently    Comment: rare   Drug use: Never     Allergies   Sulfa antibiotics, Clindamycin, and Penicillins   Review of Systems Review of Systems: :negative unless otherwise stated in HPI.      Physical Exam Triage Vital Signs ED Triage Vitals  Encounter Vitals Group     BP 08/31/22 1844 (!) 137/90     Systolic BP Percentile --      Diastolic BP Percentile --      Pulse Rate 08/31/22 1844 68     Resp 08/31/22 1844 20     Temp 08/31/22 1844 98.5 F (36.9 C)     Temp src --       SpO2 08/31/22 1844 95 %     Weight --      Height --      Head Circumference --      Peak Flow --      Pain Score 08/31/22 1842 0     Pain Loc --      Pain Education --      Exclude from Growth Chart --    No data found.  Updated Vital Signs BP (!) 137/90   Pulse 68   Temp 98.5 F (36.9 C)   Resp 20   SpO2 95%   Visual Acuity Right Eye Distance:   Left Eye Distance:   Bilateral Distance:    Right Eye Near:   Left Eye Near:    Bilateral Near:     Physical Exam GEN: well appearing female in no acute distress  CVS: well perfused  RESP: speaking in full sentences without pause      UC Treatments / Results  Labs (all labs ordered are listed, but only abnormal results are displayed) Labs Reviewed  URINE CULTURE   URINALYSIS, W/ REFLEX TO CULTURE (INFECTION SUSPECTED) - Abnormal; Notable for the following components:   APPearance HAZY (*)    Specific Gravity, Urine >1.030 (*)    Hgb urine dipstick MODERATE (*)    Protein, ur 30 (*)    Leukocytes,Ua SMALL (*)    Bacteria, UA MANY (*)    All other components within normal limits    EKG   Radiology No results found.  Procedures Procedures (including critical care time)  Medications Ordered in UC Medications - No data to display  Initial Impression / Assessment and Plan / UC Course  I have reviewed the triage vital signs and the nursing notes.  Pertinent labs & imaging results that were available during my care of the patient were reviewed by me and considered in my medical decision making (see chart for details).     Acute cystitis:  Patient is a 62 y.o. female  who presents for dysuria and urinary incontience.  Overall patient is well-appearing and afebrile.  Vital signs stable.  UA consistent with acute cystitis.  Hematuria supported on microscopy.  Treat with Macrobid 2 times daily for 5 days.  Urine culture obtained.  Follow-up sensitivities and change antibiotics, if needed.   - Return  precautions including abdominal pain, fever, chills, nausea, or vomiting given. Follow-up,  if symptoms not improving or getting  worse. Discussed MDM, treatment plan and plan for follow-up with patient who agrees with plan.        Final Clinical Impressions(s) / UC Diagnoses   Final diagnoses:  Acute cystitis with hematuria     Discharge Instructions      Stop by the pharmacy to pick up your prescriptions.  Follow up with your primary care provider as needed.      ED Prescriptions     Medication Sig Dispense Auth. Provider   nitrofurantoin, macrocrystal-monohydrate, (MACROBID) 100 MG capsule Take 1 capsule (100 mg total) by mouth 2 (two) times daily. 10 capsule Katha Cabal, DO      PDMP not reviewed this encounter.   Katha Cabal, DO 09/03/22 0214

## 2022-09-03 LAB — URINE CULTURE: Culture: >=100000 — AB

## 2022-09-04 ENCOUNTER — Ambulatory Visit: Payer: No Typology Code available for payment source | Admitting: Family Medicine

## 2022-09-14 ENCOUNTER — Telehealth: Payer: Self-pay

## 2022-09-14 NOTE — Telephone Encounter (Signed)
Called and left message to return my call concerning her referral

## 2023-01-16 ENCOUNTER — Other Ambulatory Visit: Payer: Self-pay | Admitting: Family Medicine

## 2023-01-16 DIAGNOSIS — I10 Essential (primary) hypertension: Secondary | ICD-10-CM

## 2023-01-16 DIAGNOSIS — E7800 Pure hypercholesterolemia, unspecified: Secondary | ICD-10-CM

## 2023-02-19 ENCOUNTER — Ambulatory Visit: Payer: No Typology Code available for payment source | Admitting: Family Medicine

## 2023-03-09 DIAGNOSIS — R059 Cough, unspecified: Secondary | ICD-10-CM | POA: Diagnosis not present

## 2023-03-09 DIAGNOSIS — F17200 Nicotine dependence, unspecified, uncomplicated: Secondary | ICD-10-CM | POA: Diagnosis not present

## 2023-03-09 DIAGNOSIS — I10 Essential (primary) hypertension: Secondary | ICD-10-CM | POA: Diagnosis not present

## 2023-03-09 DIAGNOSIS — F4323 Adjustment disorder with mixed anxiety and depressed mood: Secondary | ICD-10-CM | POA: Diagnosis not present

## 2023-03-23 DIAGNOSIS — M79672 Pain in left foot: Secondary | ICD-10-CM | POA: Diagnosis not present

## 2023-03-23 DIAGNOSIS — F17200 Nicotine dependence, unspecified, uncomplicated: Secondary | ICD-10-CM | POA: Diagnosis not present

## 2023-03-23 DIAGNOSIS — M79671 Pain in right foot: Secondary | ICD-10-CM | POA: Diagnosis not present

## 2023-04-25 DIAGNOSIS — R059 Cough, unspecified: Secondary | ICD-10-CM | POA: Diagnosis not present

## 2023-04-25 DIAGNOSIS — F17200 Nicotine dependence, unspecified, uncomplicated: Secondary | ICD-10-CM | POA: Diagnosis not present

## 2023-04-25 DIAGNOSIS — I10 Essential (primary) hypertension: Secondary | ICD-10-CM | POA: Diagnosis not present

## 2023-04-25 DIAGNOSIS — F4323 Adjustment disorder with mixed anxiety and depressed mood: Secondary | ICD-10-CM | POA: Diagnosis not present

## 2023-04-30 DIAGNOSIS — G629 Polyneuropathy, unspecified: Secondary | ICD-10-CM | POA: Diagnosis not present

## 2023-04-30 DIAGNOSIS — R2689 Other abnormalities of gait and mobility: Secondary | ICD-10-CM | POA: Diagnosis not present

## 2023-06-13 DIAGNOSIS — E782 Mixed hyperlipidemia: Secondary | ICD-10-CM | POA: Diagnosis not present

## 2023-06-13 DIAGNOSIS — M79641 Pain in right hand: Secondary | ICD-10-CM | POA: Diagnosis not present

## 2023-06-13 DIAGNOSIS — M79642 Pain in left hand: Secondary | ICD-10-CM | POA: Diagnosis not present

## 2023-06-13 DIAGNOSIS — R413 Other amnesia: Secondary | ICD-10-CM | POA: Diagnosis not present

## 2023-06-13 DIAGNOSIS — I10 Essential (primary) hypertension: Secondary | ICD-10-CM | POA: Diagnosis not present

## 2023-06-13 DIAGNOSIS — Z1331 Encounter for screening for depression: Secondary | ICD-10-CM | POA: Diagnosis not present

## 2023-06-13 DIAGNOSIS — Z0000 Encounter for general adult medical examination without abnormal findings: Secondary | ICD-10-CM | POA: Diagnosis not present

## 2023-06-13 DIAGNOSIS — R7303 Prediabetes: Secondary | ICD-10-CM | POA: Diagnosis not present

## 2023-06-13 DIAGNOSIS — Z1330 Encounter for screening examination for mental health and behavioral disorders, unspecified: Secondary | ICD-10-CM | POA: Diagnosis not present

## 2023-06-13 DIAGNOSIS — M79671 Pain in right foot: Secondary | ICD-10-CM | POA: Diagnosis not present

## 2023-06-13 DIAGNOSIS — G609 Hereditary and idiopathic neuropathy, unspecified: Secondary | ICD-10-CM | POA: Diagnosis not present

## 2023-06-13 DIAGNOSIS — G4733 Obstructive sleep apnea (adult) (pediatric): Secondary | ICD-10-CM | POA: Diagnosis not present

## 2023-06-13 DIAGNOSIS — G4709 Other insomnia: Secondary | ICD-10-CM | POA: Diagnosis not present

## 2023-06-13 DIAGNOSIS — E669 Obesity, unspecified: Secondary | ICD-10-CM | POA: Diagnosis not present

## 2023-06-13 DIAGNOSIS — F17200 Nicotine dependence, unspecified, uncomplicated: Secondary | ICD-10-CM | POA: Diagnosis not present

## 2023-06-13 DIAGNOSIS — G4719 Other hypersomnia: Secondary | ICD-10-CM | POA: Diagnosis not present

## 2023-07-03 ENCOUNTER — Other Ambulatory Visit: Payer: Self-pay | Admitting: Physician Assistant

## 2023-07-03 DIAGNOSIS — Z1231 Encounter for screening mammogram for malignant neoplasm of breast: Secondary | ICD-10-CM

## 2023-07-20 DIAGNOSIS — I10 Essential (primary) hypertension: Secondary | ICD-10-CM | POA: Diagnosis not present

## 2023-07-20 DIAGNOSIS — R319 Hematuria, unspecified: Secondary | ICD-10-CM | POA: Diagnosis not present

## 2023-08-02 DIAGNOSIS — H9313 Tinnitus, bilateral: Secondary | ICD-10-CM | POA: Diagnosis not present

## 2023-08-02 DIAGNOSIS — G4709 Other insomnia: Secondary | ICD-10-CM | POA: Diagnosis not present

## 2023-08-02 DIAGNOSIS — H9193 Unspecified hearing loss, bilateral: Secondary | ICD-10-CM | POA: Diagnosis not present

## 2023-08-11 DIAGNOSIS — R911 Solitary pulmonary nodule: Secondary | ICD-10-CM | POA: Diagnosis not present

## 2023-08-13 ENCOUNTER — Ambulatory Visit

## 2023-08-14 DIAGNOSIS — F17210 Nicotine dependence, cigarettes, uncomplicated: Secondary | ICD-10-CM | POA: Diagnosis not present

## 2023-09-03 ENCOUNTER — Ambulatory Visit
Admission: RE | Admit: 2023-09-03 | Discharge: 2023-09-03 | Disposition: A | Discharge status: Home / Self Care | Source: Ambulatory Visit | Attending: Physician Assistant | Admitting: Physician Assistant

## 2023-09-03 DIAGNOSIS — Z1231 Encounter for screening mammogram for malignant neoplasm of breast: Secondary | ICD-10-CM | POA: Diagnosis not present

## 2023-09-04 DIAGNOSIS — F411 Generalized anxiety disorder: Secondary | ICD-10-CM | POA: Diagnosis not present

## 2023-09-04 DIAGNOSIS — Z1331 Encounter for screening for depression: Secondary | ICD-10-CM | POA: Diagnosis not present

## 2023-09-04 DIAGNOSIS — Z1330 Encounter for screening examination for mental health and behavioral disorders, unspecified: Secondary | ICD-10-CM | POA: Diagnosis not present

## 2023-09-04 DIAGNOSIS — F330 Major depressive disorder, recurrent, mild: Secondary | ICD-10-CM | POA: Diagnosis not present

## 2023-10-01 DIAGNOSIS — H9313 Tinnitus, bilateral: Secondary | ICD-10-CM | POA: Diagnosis not present

## 2023-10-01 DIAGNOSIS — H903 Sensorineural hearing loss, bilateral: Secondary | ICD-10-CM | POA: Diagnosis not present

## 2023-10-02 DIAGNOSIS — B351 Tinea unguium: Secondary | ICD-10-CM | POA: Diagnosis not present

## 2023-10-02 DIAGNOSIS — F411 Generalized anxiety disorder: Secondary | ICD-10-CM | POA: Diagnosis not present

## 2023-10-02 DIAGNOSIS — F330 Major depressive disorder, recurrent, mild: Secondary | ICD-10-CM | POA: Diagnosis not present

## 2023-10-02 DIAGNOSIS — H9193 Unspecified hearing loss, bilateral: Secondary | ICD-10-CM | POA: Diagnosis not present

## 2023-10-24 DIAGNOSIS — L602 Onychogryphosis: Secondary | ICD-10-CM | POA: Diagnosis not present

## 2023-10-24 DIAGNOSIS — B351 Tinea unguium: Secondary | ICD-10-CM | POA: Diagnosis not present

## 2023-10-24 DIAGNOSIS — L601 Onycholysis: Secondary | ICD-10-CM | POA: Diagnosis not present

## 2023-10-24 DIAGNOSIS — R7303 Prediabetes: Secondary | ICD-10-CM | POA: Diagnosis not present

## 2023-11-14 DIAGNOSIS — E782 Mixed hyperlipidemia: Secondary | ICD-10-CM | POA: Diagnosis not present

## 2023-11-14 DIAGNOSIS — F330 Major depressive disorder, recurrent, mild: Secondary | ICD-10-CM | POA: Diagnosis not present

## 2023-11-14 DIAGNOSIS — F411 Generalized anxiety disorder: Secondary | ICD-10-CM | POA: Diagnosis not present

## 2023-11-14 DIAGNOSIS — E669 Obesity, unspecified: Secondary | ICD-10-CM | POA: Diagnosis not present

## 2023-11-14 DIAGNOSIS — G4709 Other insomnia: Secondary | ICD-10-CM | POA: Diagnosis not present

## 2023-11-14 DIAGNOSIS — I10 Essential (primary) hypertension: Secondary | ICD-10-CM | POA: Diagnosis not present

## 2023-11-14 DIAGNOSIS — F17200 Nicotine dependence, unspecified, uncomplicated: Secondary | ICD-10-CM | POA: Diagnosis not present

## 2023-11-14 DIAGNOSIS — R1013 Epigastric pain: Secondary | ICD-10-CM | POA: Diagnosis not present

## 2023-11-14 DIAGNOSIS — R7303 Prediabetes: Secondary | ICD-10-CM | POA: Diagnosis not present
